# Patient Record
Sex: Male | Born: 1970
Health system: Southern US, Community
[De-identification: ages and names within clinical notes are randomized; demographics above are authoritative.]

## PROBLEM LIST (undated history)

## (undated) DIAGNOSIS — K921 Melena: Secondary | ICD-10-CM

## (undated) DIAGNOSIS — I10 Essential (primary) hypertension: Secondary | ICD-10-CM

## (undated) DIAGNOSIS — K5900 Constipation, unspecified: Secondary | ICD-10-CM

## (undated) DIAGNOSIS — E119 Type 2 diabetes mellitus without complications: Secondary | ICD-10-CM

## (undated) DIAGNOSIS — J309 Allergic rhinitis, unspecified: Secondary | ICD-10-CM

## (undated) DIAGNOSIS — M545 Low back pain: Secondary | ICD-10-CM

## (undated) DIAGNOSIS — J45909 Unspecified asthma, uncomplicated: Secondary | ICD-10-CM

## (undated) DIAGNOSIS — K219 Gastro-esophageal reflux disease without esophagitis: Secondary | ICD-10-CM

## (undated) HISTORY — DX: Melena: K92.1

## (undated) HISTORY — DX: Allergic rhinitis, unspecified: J30.9

## (undated) HISTORY — DX: Gastro-esophageal reflux disease without esophagitis: K21.9

## (undated) HISTORY — DX: Unspecified asthma, uncomplicated: J45.909

## (undated) HISTORY — DX: Essential (primary) hypertension: I10

## (undated) HISTORY — DX: Morbid (severe) obesity due to excess calories: E66.01

## (undated) HISTORY — DX: Low back pain: M54.5

## (undated) HISTORY — DX: Constipation, unspecified: K59.00

---

## 1999-04-24 ENCOUNTER — Encounter: Payer: Self-pay | Admitting: Emergency Medicine

## 1999-04-24 ENCOUNTER — Emergency Department (HOSPITAL_COMMUNITY): Admission: EM | Admit: 1999-04-24 | Discharge: 1999-04-24 | Payer: Self-pay | Admitting: Emergency Medicine

## 2002-03-23 ENCOUNTER — Emergency Department (HOSPITAL_COMMUNITY): Admission: EM | Admit: 2002-03-23 | Discharge: 2002-03-23 | Payer: Self-pay | Admitting: Emergency Medicine

## 2005-09-09 ENCOUNTER — Ambulatory Visit: Payer: Self-pay | Admitting: Internal Medicine

## 2005-09-16 ENCOUNTER — Ambulatory Visit: Payer: Self-pay | Admitting: Internal Medicine

## 2006-10-04 ENCOUNTER — Ambulatory Visit: Payer: Self-pay | Admitting: Internal Medicine

## 2006-10-22 ENCOUNTER — Emergency Department (HOSPITAL_COMMUNITY): Admission: EM | Admit: 2006-10-22 | Discharge: 2006-10-23 | Payer: Self-pay | Admitting: Emergency Medicine

## 2006-10-28 ENCOUNTER — Ambulatory Visit: Payer: Self-pay | Admitting: Internal Medicine

## 2006-10-28 LAB — CONVERTED CEMR LAB
BUN: 12 mg/dL (ref 6–23)
Basophils Relative: 1 % (ref 0.0–1.0)
Bilirubin, Direct: 0.2 mg/dL (ref 0.0–0.3)
CO2: 28 meq/L (ref 19–32)
Eosinophils Absolute: 0.2 10*3/uL (ref 0.0–0.6)
Eosinophils Relative: 3.2 % (ref 0.0–5.0)
GFR calc Af Amer: 123 mL/min
Glucose, Bld: 102 mg/dL — ABNORMAL HIGH (ref 70–99)
HDL: 32.1 mg/dL — ABNORMAL LOW (ref >39.0)
Hemoglobin: 14.9 g/dL (ref 13.0–17.0)
Lymphocytes Relative: 43.8 % (ref 12.0–46.0)
MCV: 86.4 fL (ref 78.0–100.0)
Monocytes Absolute: 0.5 10*3/uL (ref 0.2–0.7)
Monocytes Relative: 8.4 % (ref 3.0–11.0)
Neutro Abs: 2.8 10*3/uL (ref 1.4–7.7)
PSA: 0.66 ng/mL
PSA: 0.66 ng/mL (ref 0.10–4.00)
Potassium: 3.8 meq/L (ref 3.5–5.1)
TSH: 2.5 microintl units/mL (ref 0.35–5.50)
Total Protein: 7.4 g/dL (ref 6.0–8.3)
VLDL: 28 mg/dL (ref 0–40)

## 2006-11-26 ENCOUNTER — Ambulatory Visit: Payer: Self-pay | Admitting: Internal Medicine

## 2007-04-01 DIAGNOSIS — I10 Essential (primary) hypertension: Secondary | ICD-10-CM

## 2007-04-01 HISTORY — DX: Essential (primary) hypertension: I10

## 2007-04-07 ENCOUNTER — Encounter: Payer: Self-pay | Admitting: Internal Medicine

## 2007-04-07 DIAGNOSIS — J45909 Unspecified asthma, uncomplicated: Secondary | ICD-10-CM

## 2007-04-07 DIAGNOSIS — M545 Low back pain, unspecified: Secondary | ICD-10-CM

## 2007-04-07 DIAGNOSIS — J309 Allergic rhinitis, unspecified: Secondary | ICD-10-CM

## 2007-04-07 DIAGNOSIS — K219 Gastro-esophageal reflux disease without esophagitis: Secondary | ICD-10-CM

## 2007-04-07 HISTORY — DX: Low back pain, unspecified: M54.50

## 2007-04-07 HISTORY — DX: Unspecified asthma, uncomplicated: J45.909

## 2007-04-07 HISTORY — DX: Morbid (severe) obesity due to excess calories: E66.01

## 2007-04-07 HISTORY — DX: Gastro-esophageal reflux disease without esophagitis: K21.9

## 2007-04-07 HISTORY — DX: Allergic rhinitis, unspecified: J30.9

## 2008-09-05 ENCOUNTER — Ambulatory Visit: Payer: Self-pay | Admitting: Internal Medicine

## 2008-09-05 LAB — CONVERTED CEMR LAB
AST: 39 units/L — ABNORMAL HIGH (ref 0–37)
Albumin: 4.1 g/dL (ref 3.5–5.2)
Alkaline Phosphatase: 61 units/L (ref 39–117)
BUN: 8 mg/dL (ref 6–23)
Eosinophils Relative: 3.5 % (ref 0.0–5.0)
GFR calc Af Amer: 108 mL/min
Glucose, Bld: 124 mg/dL — ABNORMAL HIGH (ref 70–99)
HCT: 44.3 % (ref 39.0–52.0)
Hemoglobin, Urine: NEGATIVE
Hemoglobin: 15.2 g/dL (ref 13.0–17.0)
Monocytes Absolute: 0.5 10*3/uL (ref 0.1–1.0)
Monocytes Relative: 7.5 % (ref 3.0–12.0)
Nitrite: NEGATIVE
Platelets: 257 10*3/uL (ref 150–400)
Potassium: 4 meq/L (ref 3.5–5.1)
RBC: 5.06 M/uL (ref 4.22–5.81)
Specific Gravity, Urine: 1.02 (ref 1.000–1.03)
Total Protein, Urine: NEGATIVE mg/dL
Total Protein: 7 g/dL (ref 6.0–8.3)
WBC: 6 10*3/uL (ref 4.5–10.5)
pH: 5.5 (ref 5.0–8.0)

## 2008-09-06 ENCOUNTER — Ambulatory Visit: Payer: Self-pay | Admitting: Internal Medicine

## 2008-09-06 DIAGNOSIS — E739 Lactose intolerance, unspecified: Secondary | ICD-10-CM | POA: Insufficient documentation

## 2009-10-29 ENCOUNTER — Ambulatory Visit: Payer: Self-pay | Admitting: Internal Medicine

## 2009-10-29 DIAGNOSIS — K5900 Constipation, unspecified: Secondary | ICD-10-CM | POA: Insufficient documentation

## 2009-10-29 DIAGNOSIS — K921 Melena: Secondary | ICD-10-CM

## 2009-10-29 HISTORY — DX: Melena: K92.1

## 2009-10-29 HISTORY — DX: Constipation, unspecified: K59.00

## 2009-10-29 LAB — CONVERTED CEMR LAB
AST: 27 units/L (ref 0–37)
Alkaline Phosphatase: 56 units/L (ref 39–117)
BUN: 10 mg/dL (ref 6–23)
Basophils Absolute: 0.1 10*3/uL (ref 0.0–0.1)
Bilirubin Urine: NEGATIVE
Calcium: 9.1 mg/dL (ref 8.4–10.5)
Cholesterol: 119 mg/dL (ref 0–200)
Creatinine, Ser: 0.9 mg/dL (ref 0.4–1.5)
GFR calc non Af Amer: 120.49 mL/min (ref >60)
Glucose, Bld: 116 mg/dL — ABNORMAL HIGH (ref 70–99)
HDL: 40.2 mg/dL (ref >39.00)
Ketones, ur: NEGATIVE mg/dL
LDL Cholesterol: 64 mg/dL (ref 0–99)
Leukocytes, UA: NEGATIVE
Lymphocytes Relative: 36.7 % (ref 12.0–46.0)
Monocytes Relative: 10.7 % (ref 3.0–12.0)
Neutrophils Relative %: 48.3 % (ref 43.0–77.0)
PSA: 0.88 ng/mL (ref 0.10–4.00)
Platelets: 248 10*3/uL (ref 150.0–400.0)
RDW: 11.4 % — ABNORMAL LOW (ref 11.5–14.6)
Sodium: 141 meq/L (ref 135–145)
TSH: 0.77 microintl units/mL (ref 0.35–5.50)
Total Bilirubin: 0.7 mg/dL (ref 0.3–1.2)
Urine Glucose: NEGATIVE mg/dL
VLDL: 15.2 mg/dL (ref 0.0–40.0)
pH: 5.5 (ref 5.0–8.0)

## 2010-03-08 ENCOUNTER — Emergency Department (HOSPITAL_COMMUNITY): Admission: EM | Admit: 2010-03-08 | Discharge: 2010-03-08 | Payer: Self-pay | Admitting: Podiatry

## 2010-03-10 ENCOUNTER — Telehealth: Payer: Self-pay | Admitting: Internal Medicine

## 2010-03-10 DIAGNOSIS — R1011 Right upper quadrant pain: Secondary | ICD-10-CM

## 2010-03-25 ENCOUNTER — Ambulatory Visit (HOSPITAL_COMMUNITY): Admission: RE | Admit: 2010-03-25 | Discharge: 2010-03-25 | Payer: Self-pay | Admitting: Internal Medicine

## 2010-03-25 ENCOUNTER — Encounter: Payer: Self-pay | Admitting: Internal Medicine

## 2010-04-01 ENCOUNTER — Encounter: Payer: Self-pay | Admitting: Internal Medicine

## 2010-06-10 ENCOUNTER — Encounter: Payer: Self-pay | Admitting: Internal Medicine

## 2010-09-16 NOTE — Progress Notes (Signed)
Summary: referral  Phone Note Call from Patient Call back at [phone] Carollee Herter (wife) cell   Caller: Spouse Reason for Call: Referral Summary of Call: Pt's wife Carollee Herter is calling for a referral to a surgeon. Pt was seen in the ER on Saturday and was diagnosed with gallstones. She states that he is in a lot of pain even with medication and if possible would like a referral this week--please advise Initial call taken by: Brenton Grills MA,  March 10, 2010 10:11 AM  Follow-up for Phone Call        I reviewed the ER record; all normal except for the gallstones;  will need HIDA scan if having persistent pain, before surgeon would consider any surgury since the GB itself looked normal on the ultrasound  rec:  HIDA scan - I will order Follow-up by: Corwin Levins MD,  March 10, 2010 12:53 PM  Additional Follow-up for Phone Call Additional follow up Details #1::        spouse notified. Additional Follow-up by: Lucious Groves CMA,  March 10, 2010 3:09 PM  New Problems: RUQ PAIN (ICD-789.01)   New Problems: RUQ PAIN (ICD-789.01)

## 2010-09-16 NOTE — Assessment & Plan Note (Signed)
Summary: STOMACH PROBLEMS/NWS  #   Vital Signs:  Patient profile:   40 year old male Height:      68 inches Weight:      292.75 pounds BMI:     44.67 O2 Sat:      98 % on Room air Temp:     98.4 degrees F oral Pulse rate:   64 / minute BP sitting:   120 / 76  (left arm) Cuff size:   large  Vitals Entered ByZella Ball Ewing (October 29, 2009 9:28 AM)  O2 Flow:  Room air  CC: Stomach problems/RE   CC:  Stomach problems/RE.  History of Present Illness: pain not overly severe  , in fact denies pain, but feels some "discomfort"  only seems to occur jsut after eating, eats twice per day;  overall mild, lasting aobut 1 wk;  located mid lower abd, some to both sides a bit more on the right than left;  BM and passing gas tend to make better;  has been more constipated more lately in that less volume seems to be passed but no diet change or activity or work or "routine wise';  feels sluggish overall and has some discomfort in the testicle area that he assumed was due to sitting so much at work;  been off this wk and no testicle pain; denies STD risk (married and wears condoms);  discomfort not radiating to the back, although he has had usual lower back pain that are chroinc for him that are worse with riding, sitting,  (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 low grade temp ( ? caught virus from the son) but only seems to last 24 hrs.  Tried miralax but seemed to be worse the second day in that the pain was worse and did not have BM that day.    Problems Prior to Update: 1)  Glucose Intolerance  (ICD-271.3) 2)  Preventive Health Care  (ICD-V70.0) 3)  Low Back Pain  (ICD-724.2) 4)  Gerd  (ICD-530.81) 5)  Asthma  (ICD-493.90) 6)  Allergic Rhinitis  (ICD-477.9) 7)  Morbid Obesity  (ICD-278.01) 8)  Family History Diabetes 1st Degree Relative  (ICD-V18.0) 9)  Hypertension  (ICD-401.9)  Medications Prior to Update: 1)  Lisinopril 10 Mg Tabs  (Lisinopril) .Marland Kitchen.. 1 By Mouth Once Daily 2)  Adult Aspirin Ec Low Strength 81 Mg Tbec (Aspirin) .Marland Kitchen.. 1 By Mouth Once Daily 3)  Proair Hfa 108 (90 Base) Mcg/act Aers (Albuterol Sulfate) .... 2 Puffs Four Times Per Day As Needed 4)  Zantac 300 Mg Tabs (Ranitidine Hcl) .Marland Kitchen.. 1 By Mouth Once Daily  Current Medications (verified): 1)  Lisinopril 10 Mg Tabs (Lisinopril) .Marland Kitchen.. 1 By Mouth Once Daily 2)  Adult Aspirin Ec Low Strength 81 Mg Tbec (Aspirin) .Marland Kitchen.. 1 By Mouth Once Daily 3)  Proair Hfa 108 (90 Base) Mcg/act Aers (Albuterol Sulfate) .... 2 Puffs Four Times Per Day As Needed 4)  Ranitidine Hcl 150 Mg Caps (Ranitidine Hcl) .Marland Kitchen.. 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         all otherwise negative per pt -    Physical Exam  General:  alert and overweight-appearing.   Head:   normocephalic and atraumatic.   Eyes:  vision grossly intact, pupils equal, and pupils round.   Ears:  R ear normal and L ear normal.   Nose:  no external deformity and no nasal discharge.   Mouth:  no gingival abnormalities and pharynx pink and moist.   Neck:  supple and no masses.   Lungs:  normal respiratory effort and normal breath sounds.   Heart:  normal rate and regular rhythm.   Abdomen:  soft, non-tender, and normal bowel sounds.   Rectal:  deferred per pt Msk:  no joint tenderness and no joint swelling.   Extremities:  no edema, no erythema  Neurologic:  cranial nerves II-XII intact and strength normal in all extremities.     Impression & Recommendations:  Problem # 1:  Preventive Health Care (ICD-V70.0)  Overall doing well, age appropriate education and counseling updated and referral for appropriate preventive services done unless declined, immunizations up to date or declined, diet counseling done if overweight, urged to quit smoking if smokes , most recent labs reviewed and current ordered if appropriate, ecg reviewed or declined (interpretation per ECG scanned in the EMR if done); information regarding Medicare Prevention requirements given if appropriate   Orders: TLB-BMP (Basic Metabolic Panel-BMET) (80048-METABOL) TLB-CBC Platelet - w/Differential (85025-CBCD) TLB-Hepatic/Liver Function Pnl (80076-HEPATIC) TLB-Lipid Panel (80061-LIPID) TLB-TSH (Thyroid Stimulating Hormone) (84443-TSH) TLB-PSA (Prostate Specific Antigen) (84153-PSA) TLB-Udip ONLY (81003-UDIP)  Problem # 2:  CONSTIPATION (ICD-564.00) for OTC mag citrate, more active and cont'd diet fiber  Problem # 3:  HEMATOCHEZIA (ICD-578.1) small volume - declines colonsocpy at this time  Problem # 4:  GLUCOSE INTOLERANCE (ICD-271.3)  Orders: TLB-A1C / Hgb A1C (Glycohemoglobin) (83036-A1C) asympt - for a1c check  Complete Medication List: 1)  Lisinopril 10 Mg Tabs (Lisinopril) .Marland Kitchen.. 1 by mouth once  daily 2)  Adult Aspirin Ec Low Strength 81 Mg Tbec (Aspirin) .Marland Kitchen.. 1 by mouth once daily 3)  Proair Hfa 108 (90 Base) Mcg/act Aers (Albuterol sulfate) .... 2 puffs four times per day as needed 4)  Ranitidine Hcl 150 Mg Caps (Ranitidine hcl) .Marland Kitchen.. 1 by mouth once daily  Patient Instructions: 1)  Please go to the Lab in the basement for your blood and/or urine tests today 2)  please call if you would like the colonscopy to be ordered 3)  Continue all previous medications as before this visit  4)  you can try the senakot oTC daily , and/or colace 100 mg by mouth two times a day for stool softner 5)  you can also use for now the magnesium citrate - 1 bottle by mouth x 1 now 6)  Your presciriptoins were sent to your pharmacy 7)  Please schedule a follow-up appointment in 1 year or sooner if needed Prescriptions: RANITIDINE HCL 150 MG CAPS (RANITIDINE HCL) 1 by mouth once daily  #  90 x 3   Entered and Authorized by:   Corwin Levins MD   Signed by:   Corwin Levins MD on 10/29/2009   Method used:   Electronically to        [facility]. [address] [phone]* (retail)       [address]       [place], [place]  [address]       Ph: [phone] or [phone]       Fax: [phone]   RxID:   [id number] PROAIR HFA 108 (90 BASE) MCG/ACT AERS (ALBUTEROL SULFATE) 2 puffs four times per day as needed  #1 x 11   Entered and Authorized by:   Corwin Levins MD   Signed by:   Corwin Levins MD on 10/29/2009   Method used:   Electronically to        [facility] [address] [phone]* (retail)       [address]       [place], [place]  [address]       Ph: [phone] or [phone]       Fax: [phone]   RxID:   [id number] LISINOPRIL 10 MG TABS (LISINOPRIL) 1 by mouth once daily  #90 x 3   Entered and Authorized by:   Corwin Levins MD   Signed by:   Corwin Levins MD on 10/29/2009   Method used:   Electronically to        [facility]. [address] [phone]* (retail)       [address]       [place], [place]  [address]       Ph: [phone] or [phone]       Fax: [phone]   RxID:   [id number]

## 2010-09-16 NOTE — Miscellaneous (Signed)
Summary: Orders Update   Clinical Lists Changes  Orders: Added new Referral order of Surgical Referral (Surgery) - Signed 

## 2010-09-16 NOTE — Consult Note (Signed)
Summary: [facility]   Imported By: Sherian Rein 04/15/2010 11:45:30  _____________________________________________________________________  External Attachment:    Type:   Image     Comment:   External Document

## 2010-09-16 NOTE — Letter (Signed)
Summary: [facility]   Imported By: Lennie Odor 06/17/2010 11:02:11  _____________________________________________________________________  External Attachment:    Type:   Image     Comment:   External Document

## 2010-11-01 LAB — DIFFERENTIAL
Basophils Absolute: 0 10*3/uL (ref 0.0–0.1)
Basophils Relative: 0 % (ref 0–1)
Eosinophils Relative: 1 % (ref 0–5)
Monocytes Absolute: 0.5 10*3/uL (ref 0.1–1.0)
Neutro Abs: 5.5 10*3/uL (ref 1.7–7.7)

## 2010-11-01 LAB — URINALYSIS, ROUTINE W REFLEX MICROSCOPIC
Glucose, UA: NEGATIVE mg/dL
Hgb urine dipstick: NEGATIVE
Protein, ur: NEGATIVE mg/dL
pH: 5 (ref 5.0–8.0)

## 2010-11-01 LAB — CBC
HCT: 45.5 % (ref 39.0–52.0)
Hemoglobin: 15.6 g/dL (ref 13.0–17.0)
MCV: 88.7 fL (ref 78.0–100.0)
RBC: 5.14 MIL/uL (ref 4.22–5.81)
RDW: 12.4 % (ref 11.5–15.5)
WBC: 7.6 10*3/uL (ref 4.0–10.5)

## 2010-11-01 LAB — POCT CARDIAC MARKERS
CKMB, poc: <1 ng/mL — ABNORMAL LOW (ref 1.0–8.0)
Myoglobin, poc: 90.8 ng/mL (ref 12–200)
Troponin i, poc: <0.05 ng/mL (ref 0.00–0.09)

## 2010-11-01 LAB — COMPREHENSIVE METABOLIC PANEL
AST: 36 U/L (ref 0–37)
Albumin: 4.2 g/dL (ref 3.5–5.2)
Alkaline Phosphatase: 61 U/L (ref 39–117)
BUN: 12 mg/dL (ref 6–23)
CO2: 22 mEq/L (ref 19–32)
Chloride: 106 mEq/L (ref 96–112)
Potassium: 4 mEq/L (ref 3.5–5.1)
Total Bilirubin: 0.8 mg/dL (ref 0.3–1.2)

## 2010-11-08 ENCOUNTER — Other Ambulatory Visit: Payer: Self-pay | Admitting: Internal Medicine

## 2010-12-15 ENCOUNTER — Other Ambulatory Visit: Payer: Self-pay | Admitting: Internal Medicine

## 2011-01-16 ENCOUNTER — Other Ambulatory Visit: Payer: Self-pay | Admitting: Internal Medicine

## 2011-03-08 ENCOUNTER — Encounter: Payer: Self-pay | Admitting: Internal Medicine

## 2011-03-08 DIAGNOSIS — E119 Type 2 diabetes mellitus without complications: Secondary | ICD-10-CM

## 2011-03-08 DIAGNOSIS — Z0001 Encounter for general adult medical examination with abnormal findings: Secondary | ICD-10-CM | POA: Insufficient documentation

## 2011-03-08 DIAGNOSIS — Z0000 Encounter for general adult medical examination without abnormal findings: Secondary | ICD-10-CM | POA: Insufficient documentation

## 2011-03-08 HISTORY — DX: Type 2 diabetes mellitus without complications: E11.9

## 2011-03-10 ENCOUNTER — Encounter: Payer: Self-pay | Admitting: Internal Medicine

## 2011-03-16 ENCOUNTER — Other Ambulatory Visit: Payer: Self-pay | Admitting: Internal Medicine

## 2011-03-16 ENCOUNTER — Other Ambulatory Visit (INDEPENDENT_AMBULATORY_CARE_PROVIDER_SITE_OTHER): Payer: Self-pay

## 2011-03-16 DIAGNOSIS — Z0000 Encounter for general adult medical examination without abnormal findings: Secondary | ICD-10-CM

## 2011-03-16 DIAGNOSIS — Z1289 Encounter for screening for malignant neoplasm of other sites: Secondary | ICD-10-CM

## 2011-03-16 LAB — BASIC METABOLIC PANEL
GFR: 118.14 mL/min (ref >60.00)
Potassium: 4.1 mEq/L (ref 3.5–5.1)
Sodium: 143 mEq/L (ref 135–145)

## 2011-03-16 LAB — CBC WITH DIFFERENTIAL/PLATELET
Eosinophils Relative: 3.4 % (ref 0.0–5.0)
HCT: 45.9 % (ref 39.0–52.0)
Hemoglobin: 15.1 g/dL (ref 13.0–17.0)
Lymphs Abs: 2.7 10*3/uL (ref 0.7–4.0)
Monocytes Relative: 9.9 % (ref 3.0–12.0)
Neutro Abs: 3.6 10*3/uL (ref 1.4–7.7)
Platelets: 275 10*3/uL (ref 150.0–400.0)
WBC: 7.3 10*3/uL (ref 4.5–10.5)

## 2011-03-16 LAB — LIPID PANEL
Cholesterol: 135 mg/dL (ref 0–200)
LDL Cholesterol: 73 mg/dL (ref 0–99)
VLDL: 27.2 mg/dL (ref 0.0–40.0)

## 2011-03-16 LAB — PSA: PSA: 1.02 ng/mL (ref 0.10–4.00)

## 2011-03-16 LAB — HEPATIC FUNCTION PANEL
ALT: 43 U/L (ref 0–53)
AST: 23 U/L (ref 0–37)
Albumin: 4.4 g/dL (ref 3.5–5.2)
Total Protein: 7.3 g/dL (ref 6.0–8.3)

## 2011-03-16 LAB — URINALYSIS
Bilirubin Urine: NEGATIVE
Hgb urine dipstick: NEGATIVE
Ketones, ur: NEGATIVE
Nitrite: NEGATIVE
Total Protein, Urine: NEGATIVE

## 2011-03-17 ENCOUNTER — Encounter: Payer: Self-pay | Admitting: Internal Medicine

## 2011-03-17 ENCOUNTER — Ambulatory Visit (INDEPENDENT_AMBULATORY_CARE_PROVIDER_SITE_OTHER): Payer: BC Managed Care – PPO | Admitting: Internal Medicine

## 2011-03-17 VITALS — BP 120/82 | HR 77 | Temp 98.6°F | Ht 68.0 in | Wt 294.4 lb

## 2011-03-17 DIAGNOSIS — R609 Edema, unspecified: Secondary | ICD-10-CM

## 2011-03-17 DIAGNOSIS — Z0000 Encounter for general adult medical examination without abnormal findings: Secondary | ICD-10-CM

## 2011-03-17 DIAGNOSIS — R7309 Other abnormal glucose: Secondary | ICD-10-CM

## 2011-03-17 DIAGNOSIS — I10 Essential (primary) hypertension: Secondary | ICD-10-CM

## 2011-03-17 DIAGNOSIS — R7302 Impaired glucose tolerance (oral): Secondary | ICD-10-CM

## 2011-03-17 MED ORDER — LISINOPRIL-HYDROCHLOROTHIAZIDE 10-12.5 MG PO TABS: 1.0000 | ORAL_TABLET | Freq: Every day | ORAL | Status: DC

## 2011-03-17 NOTE — Assessment & Plan Note (Signed)
Trace bilat LE edema, prob venous insuff;  To change the BP med to lis-hct as per emr.,  to f/u any worsening symptoms or concerns

## 2011-03-17 NOTE — Assessment & Plan Note (Signed)
Improved, BP med changed to add the hct today due to edema,  to f/u any worsening symptoms or concerns

## 2011-03-17 NOTE — Progress Notes (Signed)
Subjective:    Patient ID: Jerome [name], male    DOB: 01-19-1971, 40 y.o.   MRN: [id number]  HPI  Here for wellness and f/u;  Overall doing ok;  Pt denies CP, worsening SOB, DOE, wheezing, orthopnea, PND, worsening LE edema, palpitations, dizziness or syncope.  Pt denies neurological change such as new Headache, facial or extremity weakness.  Pt denies polydipsia, polyuria, or low sugar symptoms. Pt states overall good compliance with treatment and medications, good tolerability, and trying to follow lower cholesterol diet.  Pt denies worsening depressive symptoms, suicidal ideation or panic. No fever, wt loss, night sweats, loss of appetite, or other constitutional symptoms.  Pt states good ability with ADL's, low fall risk, home safety reviewed and adequate, no significant changes in hearing or vision, and occasionally active with exercise.  Hard to lose wt as he works third shift, will be changing shift n the next couple. Does have some snoring at night, but o/w no daytime somnolence. Works 65-70 hrs per wk.  Has good diet, but cant find time to exercise.  Past Medical History  Diagnosis Date  . Impaired glucose tolerance 03/08/2011  . ALLERGIC RHINITIS 04/07/2007  . ASTHMA 04/07/2007  . CONSTIPATION 10/29/2009  . GERD 04/07/2007  . HEMATOCHEZIA 10/29/2009  . HYPERTENSION 04/01/2007  . LOW BACK PAIN 04/07/2007  . Morbid obesity 04/07/2007   No past surgical history on file.  reports that he has been smoking.  He does not have any smokeless tobacco history on file. He reports that he drinks alcohol. He reports that he does not use illicit drugs. family history includes Diabetes in his other; Prostate cancer in his other; Stroke in his other; and Throat cancer in his other. No Known Allergies Current Outpatient Prescriptions on File Prior to Visit  Medication Sig Dispense Refill  . lisinopril (PRINIVIL,ZESTRIL) 10 MG tablet TAKE 1 TABLET BY MOUTH EVERY DAY  30 tablet  1  . albuterol (PROAIR HFA) 108  (90 BASE) MCG/ACT inhaler Inhale 2 puffs into the lungs 4 (four) times daily.        Marland Kitchen aspirin 81 MG tablet Take 81 mg by mouth daily.        . ranitidine (ZANTAC) 150 MG tablet Take 150 mg by mouth daily.         Review of Systems Review of Systems  Constitutional: Negative for diaphoresis, activity change, appetite change and unexpected weight change.  HENT: Negative for hearing loss, ear pain, facial swelling, mouth sores and neck stiffness.   Eyes: Negative for pain, redness and visual disturbance.  Respiratory: Negative for shortness of breath and wheezing.   Cardiovascular: Negative for chest pain and palpitations.  Gastrointestinal: Negative for diarrhea, blood in stool, abdominal distention and rectal pain.  Genitourinary: Negative for hematuria, flank pain and decreased urine volume.  Musculoskeletal: Negative for myalgias and joint swelling.  Skin: Negative for color change and wound.  Neurological: Negative for syncope and numbness.  Hematological: Negative for adenopathy.  Psychiatric/Behavioral: Negative for hallucinations, self-injury, decreased concentration and agitation.      Objective:   Physical Exam BP 120/82  Pulse 77  Temp(Src) 98.6 F (37 C) (Oral)  Ht 5\' 8"  (1.727 m)  Wt 294 lb 6 oz (133.528 kg)  BMI 44.76 kg/m2  SpO2 96% Physical Exam  VS noted Constitutional: Pt is oriented to person, place, and time. Appears well-developed and well-nourished.  HENT:  Head: Normocephalic and atraumatic.  Right Ear: External ear normal.  Left Ear: External ear normal.  Nose: Nose normal.  Mouth/Throat: Oropharynx is clear and moist.  Eyes: Conjunctivae and EOM are normal. Pupils are equal, round, and reactive to light.  Neck: Normal range of motion. Neck supple. No JVD present. No tracheal deviation present.  Cardiovascular: Normal rate, regular rhythm, normal heart sounds and intact distal pulses.   Pulmonary/Chest: Effort normal and breath sounds normal.    Abdominal: Soft. Bowel sounds are normal. There is no tenderness.  Musculoskeletal: Normal range of motion. Exhibits trace edema ankle bilat  Lymphadenopathy:  Has no cervical adenopathy.  Neurological: Pt is alert and oriented to person, place, and time. Pt has normal reflexes. No cranial nerve deficit.  Skin: Skin is warm and dry. No rash noted.  Psychiatric:  Has  normal mood and affect. Behavior is normal.         Assessment & Plan:

## 2011-03-17 NOTE — Patient Instructions (Signed)
Stop the lisinopril 10 mg per day Take all new medications as prescribed - the lisinopril-HCT 10/12.5 mg - 1 per day Please continue to monitor your BP regularly;  Your goal is to be < 140/90 You are otherwise uptodate with your prevention .Please return in 1 year for your yearly visit, or sooner if needed, with Lab testing done 3-5 days before

## 2011-03-17 NOTE — Assessment & Plan Note (Signed)

## 2011-03-20 ENCOUNTER — Other Ambulatory Visit: Payer: Self-pay | Admitting: Internal Medicine

## 2011-06-02 ENCOUNTER — Telehealth: Payer: Self-pay

## 2011-06-02 MED ORDER — LOSARTAN POTASSIUM 100 MG PO TABS: 100.0000 mg | ORAL_TABLET | Freq: Every day | ORAL | Status: DC

## 2011-06-02 NOTE — Telephone Encounter (Signed)
Ok to change to losartan 100 qd (rememeber the numbers dont compare in any way to the old medicine numbers)

## 2011-06-02 NOTE — Telephone Encounter (Signed)
Pt informed of Rx/pharmacy via VM,

## 2011-06-02 NOTE — Telephone Encounter (Signed)
Pt called stating his BP medication is causing fatigue, dizziness and deceased appetite. Pt is requesting Rx for alternative medication, please advise.

## 2011-10-23 ENCOUNTER — Telehealth: Payer: Self-pay

## 2011-10-23 NOTE — Telephone Encounter (Signed)
Can he check BP either at home, pharmacy or come here ?  If not able, should take HALF pill until able to come for BP nurse check next wk

## 2011-10-23 NOTE — Telephone Encounter (Signed)
Called left message to call back 

## 2011-10-23 NOTE — Telephone Encounter (Signed)
Pt called stating he has been experiencing lightheadedness, tingling in finger tips, jittery and nervous since starting on new BP medication. Pt is requesting advisement from MD.

## 2011-10-26 NOTE — Telephone Encounter (Signed)
Patient informed, he stated he went to UC over the weekend and stated he was feeling much better.

## 2011-11-20 ENCOUNTER — Encounter: Payer: Self-pay | Admitting: *Deleted

## 2012-01-26 ENCOUNTER — Telehealth: Payer: Self-pay | Admitting: *Deleted

## 2012-01-26 ENCOUNTER — Encounter: Payer: Self-pay | Admitting: *Deleted

## 2012-01-26 NOTE — Telephone Encounter (Signed)
Called patient to let him know that he can stop the prednisone. Patient verbalized understanding.

## 2012-02-10 ENCOUNTER — Encounter: Payer: Self-pay | Admitting: *Deleted

## 2012-02-10 NOTE — Progress Notes (Signed)
Faxed signed clearance for surgery from oncologist standpoint to dr Thurston Hole [phone]

## 2012-03-02 ENCOUNTER — Telehealth: Payer: Self-pay | Admitting: *Deleted

## 2012-03-02 ENCOUNTER — Encounter: Payer: Self-pay | Admitting: Oncology

## 2012-03-02 NOTE — Telephone Encounter (Signed)
Wife calling to say patient was in the E.R.last night, with elevated temp. They drew labs, did chest-xray, gave po and iv potassium and put him on an antibiotic, (she does not know the name). Temperature today is 100.  Wife would like for him to be seen by dr Clelia Croft, sooner than scheduled appt for august 9th.

## 2012-03-02 NOTE — Telephone Encounter (Signed)
No note

## 2012-03-02 NOTE — Telephone Encounter (Signed)
Spoke with wife, if temp goes above 100, we will see patient this week.

## 2012-04-12 ENCOUNTER — Telehealth: Payer: Self-pay

## 2012-04-12 DIAGNOSIS — Z0000 Encounter for general adult medical examination without abnormal findings: Secondary | ICD-10-CM

## 2012-04-12 NOTE — Telephone Encounter (Signed)
Put order in for physical labs. 

## 2012-06-14 ENCOUNTER — Other Ambulatory Visit: Payer: Self-pay

## 2012-06-14 MED ORDER — LOSARTAN POTASSIUM 100 MG PO TABS: 100.0000 mg | ORAL_TABLET | Freq: Every day | ORAL | Status: DC

## 2012-06-15 ENCOUNTER — Other Ambulatory Visit (INDEPENDENT_AMBULATORY_CARE_PROVIDER_SITE_OTHER): Payer: BC Managed Care – PPO

## 2012-06-15 DIAGNOSIS — Z0000 Encounter for general adult medical examination without abnormal findings: Secondary | ICD-10-CM

## 2012-06-15 LAB — TSH: TSH: 1.25 u[IU]/mL (ref 0.35–5.50)

## 2012-06-15 LAB — PSA: PSA: 0.9 ng/mL (ref 0.10–4.00)

## 2012-06-15 LAB — CBC WITH DIFFERENTIAL/PLATELET
Basophils Absolute: 0 10*3/uL (ref 0.0–0.1)
Eosinophils Absolute: 0.2 10*3/uL (ref 0.0–0.7)
Hemoglobin: 15.3 g/dL (ref 13.0–17.0)
Lymphocytes Relative: 40.8 % (ref 12.0–46.0)
Monocytes Relative: 7.7 % (ref 3.0–12.0)
Neutrophils Relative %: 47.8 % (ref 43.0–77.0)
Platelets: 276 10*3/uL (ref 150.0–400.0)
RDW: 11.9 % (ref 11.5–14.6)

## 2012-06-15 LAB — URINALYSIS, ROUTINE W REFLEX MICROSCOPIC
Bilirubin Urine: NEGATIVE
Hgb urine dipstick: NEGATIVE
Leukocytes, UA: NEGATIVE
Nitrite: NEGATIVE
Total Protein, Urine: NEGATIVE

## 2012-06-15 LAB — BASIC METABOLIC PANEL
BUN: 10 mg/dL (ref 6–23)
CO2: 28 mEq/L (ref 19–32)
Calcium: 9.4 mg/dL (ref 8.4–10.5)
Creatinine, Ser: 1 mg/dL (ref 0.4–1.5)

## 2012-06-15 LAB — LIPID PANEL
Cholesterol: 143 mg/dL (ref 0–200)
LDL Cholesterol: 83 mg/dL (ref 0–99)
Total CHOL/HDL Ratio: 4
VLDL: 22.2 mg/dL (ref 0.0–40.0)

## 2012-06-15 LAB — HEPATIC FUNCTION PANEL
ALT: 61 U/L — ABNORMAL HIGH (ref 0–53)
AST: 33 U/L (ref 0–37)
Albumin: 3.7 g/dL (ref 3.5–5.2)
Total Bilirubin: 0.5 mg/dL (ref 0.3–1.2)

## 2012-06-16 LAB — HEMOGLOBIN A1C: Hgb A1c MFr Bld: 5.9 % (ref 4.6–6.5)

## 2012-06-17 ENCOUNTER — Encounter: Payer: Self-pay | Admitting: Internal Medicine

## 2012-06-17 ENCOUNTER — Ambulatory Visit (INDEPENDENT_AMBULATORY_CARE_PROVIDER_SITE_OTHER): Payer: BC Managed Care – PPO | Admitting: Internal Medicine

## 2012-06-17 VITALS — BP 112/80 | HR 97 | Temp 98.2°F | Ht 68.0 in | Wt 303.2 lb

## 2012-06-17 DIAGNOSIS — R7302 Impaired glucose tolerance (oral): Secondary | ICD-10-CM

## 2012-06-17 DIAGNOSIS — Z0000 Encounter for general adult medical examination without abnormal findings: Secondary | ICD-10-CM

## 2012-06-17 DIAGNOSIS — R7309 Other abnormal glucose: Secondary | ICD-10-CM

## 2012-06-17 NOTE — Patient Instructions (Addendum)
Ok to stay off the losartan Please check your blood pressure on a regular basis; your goal is to be less than 140/90 Continue all other medications as before Please have the pharmacy call with any refills you may need. Please continue your efforts at being more active, low cholesterol diet, and weight control. Please return in 1 year for your yearly visit, or sooner if needed, with Lab testing done 3-5 days before

## 2012-06-17 NOTE — Progress Notes (Signed)
Subjective:    Patient ID: Jerome [name], male    DOB: October 28, 1970, 41 y.o.   MRN: [id number]  HPI Here for wellness and f/u;  Overall doing ok;  Pt denies CP, worsening SOB, DOE, wheezing, orthopnea, PND, worsening LE edema, palpitations, dizziness or syncope.  Pt denies neurological change such as new Headache, facial or extremity weakness.  Pt denies polydipsia, polyuria, or low sugar symptoms. Pt states overall good compliance with treatment and medications, good tolerability, and trying to follow lower cholesterol diet.  Pt denies worsening depressive symptoms, suicidal ideation or panic. No fever, wt loss, night sweats, loss of appetite, or other constitutional symptoms.  Pt states good ability with ADL's, low fall risk, home safety reviewed and adequate, no significant changes in hearing or vision, and occasionally active with exercise.  Had to stop the losartan due to dizziness, BP ok at home.  Does have sense of ongoing fatigue, but denies signficant hypersomnolence. Still works 3 rd shift. Past Medical History  Diagnosis Date  . Impaired glucose tolerance 03/08/2011  . ALLERGIC RHINITIS 04/07/2007  . ASTHMA 04/07/2007  . CONSTIPATION 10/29/2009  . GERD 04/07/2007  . HEMATOCHEZIA 10/29/2009  . HYPERTENSION 04/01/2007  . LOW BACK PAIN 04/07/2007  . Morbid obesity 04/07/2007   No past surgical history on file.  reports that he has been smoking.  He does not have any smokeless tobacco history on file. He reports that he drinks alcohol. He reports that he does not use illicit drugs. family history includes Diabetes in his other; Prostate cancer in his other; Stroke in his other; and Throat cancer in his other. No Known Allergies Current Outpatient Prescriptions on File Prior to Visit  Medication Sig Dispense Refill  . aspirin 81 MG tablet Take 81 mg by mouth daily.        Marland Kitchen losartan (COZAAR) 100 MG tablet Take 1 tablet (100 mg total) by mouth daily.  90 tablet  0  . PROAIR HFA 108 (90 BASE)  MCG/ACT inhaler USE 2 PUFFS FOUR TIMES DAILY AS NEEDED  1 Inhaler  11  . ranitidine (ZANTAC) 150 MG tablet Take 150 mg by mouth daily.         Review of Systems Review of Systems  Constitutional: Negative for diaphoresis, activity change, appetite change and unexpected weight change.  HENT: Negative for hearing loss, ear pain, facial swelling, mouth sores and neck stiffness.   Eyes: Negative for pain, redness and visual disturbance.  Respiratory: Negative for shortness of breath and wheezing.   Cardiovascular: Negative for chest pain and palpitations.  Gastrointestinal: Negative for diarrhea, blood in stool, abdominal distention and rectal pain.  Genitourinary: Negative for hematuria, flank pain and decreased urine volume.  Musculoskeletal: Negative for myalgias and joint swelling.  Skin: Negative for color change and wound.  Neurological: Negative for syncope and numbness.  Hematological: Negative for adenopathy.  Psychiatric/Behavioral: Negative for hallucinations, self-injury, decreased concentration and agitation.      Objective:   Physical Exam BP 112/80  Pulse 97  Temp 98.2 F (36.8 C) (Oral)  Ht 5\' 8"  (1.727 m)  Wt 303 lb 4 oz (137.553 kg)  BMI 46.11 kg/m2  SpO2 95% Physical Exam  VS noted Constitutional: Pt is oriented to person, place, and time. Appears well-developed and well-nourished./obese  HENT:  Head: Normocephalic and atraumatic.  Right Ear: External ear normal.  Left Ear: External ear normal.  Nose: Nose normal.  Mouth/Throat: Oropharynx is clear and moist.  Eyes: Conjunctivae and EOM are normal. Pupils  are equal, round, and reactive to light.  Neck: Normal range of motion. Neck supple. No JVD present. No tracheal deviation present.  Cardiovascular: Normal rate, regular rhythm, normal heart sounds and intact distal pulses.   Pulmonary/Chest: Effort normal and breath sounds normal.  Abdominal: Soft. Bowel sounds are normal. There is no tenderness.    Musculoskeletal: Normal range of motion. Exhibits no edema.  Lymphadenopathy:  Has no cervical adenopathy.  Neurological: Pt is alert and oriented to person, place, and time. Pt has normal reflexes. No cranial nerve deficit.  Skin: Skin is warm and dry. No rash noted.  Psychiatric:  Has  normal mood and affect. Behavior is normal.     Assessment & Plan:

## 2012-06-18 ENCOUNTER — Encounter: Payer: Self-pay | Admitting: Internal Medicine

## 2012-06-18 NOTE — Assessment & Plan Note (Signed)

## 2012-06-18 NOTE — Assessment & Plan Note (Signed)
stable overall by hx and exam, most recent data reviewed with pt, and pt to continue medical treatment as before Lab Results  Component Value Date   HGBA1C 5.9 06/15/2012

## 2012-08-17 HISTORY — PX: CHOLECYSTECTOMY: SHX55

## 2012-09-12 ENCOUNTER — Telehealth: Payer: Self-pay | Admitting: *Deleted

## 2012-09-12 NOTE — Telephone Encounter (Signed)
Patient to have radiation treatments, before starting jevtana, chemotherapy. Cancelled chemo and inj appt for 1/29 and 1/30, respectively.

## 2012-10-24 ENCOUNTER — Ambulatory Visit (INDEPENDENT_AMBULATORY_CARE_PROVIDER_SITE_OTHER): Payer: BC Managed Care – PPO | Admitting: Internal Medicine

## 2012-10-24 ENCOUNTER — Telehealth: Payer: Self-pay | Admitting: Internal Medicine

## 2012-10-24 ENCOUNTER — Other Ambulatory Visit (INDEPENDENT_AMBULATORY_CARE_PROVIDER_SITE_OTHER): Payer: BC Managed Care – PPO

## 2012-10-24 ENCOUNTER — Ambulatory Visit (INDEPENDENT_AMBULATORY_CARE_PROVIDER_SITE_OTHER)
Admission: RE | Admit: 2012-10-24 | Discharge: 2012-10-24 | Disposition: A | Discharge status: Home / Self Care | Payer: BC Managed Care – PPO | Source: Ambulatory Visit | Attending: Internal Medicine | Admitting: Internal Medicine

## 2012-10-24 ENCOUNTER — Encounter: Payer: Self-pay | Admitting: Internal Medicine

## 2012-10-24 VITALS — BP 118/80 | HR 81 | Temp 98.1°F | Ht 68.5 in | Wt 285.0 lb

## 2012-10-24 DIAGNOSIS — R7302 Impaired glucose tolerance (oral): Secondary | ICD-10-CM

## 2012-10-24 DIAGNOSIS — R634 Abnormal weight loss: Secondary | ICD-10-CM

## 2012-10-24 DIAGNOSIS — R7309 Other abnormal glucose: Secondary | ICD-10-CM

## 2012-10-24 DIAGNOSIS — R350 Frequency of micturition: Secondary | ICD-10-CM

## 2012-10-24 DIAGNOSIS — Z0000 Encounter for general adult medical examination without abnormal findings: Secondary | ICD-10-CM

## 2012-10-24 DIAGNOSIS — I10 Essential (primary) hypertension: Secondary | ICD-10-CM

## 2012-10-24 LAB — CORTISOL: Cortisol, Plasma: 3.9 ug/dL

## 2012-10-24 LAB — HEPATIC FUNCTION PANEL
Albumin: 3.9 g/dL (ref 3.5–5.2)
Alkaline Phosphatase: 85 U/L (ref 39–117)
Bilirubin, Direct: 0.2 mg/dL (ref 0.0–0.3)

## 2012-10-24 LAB — LIPID PANEL
LDL Cholesterol: 61 mg/dL (ref 0–99)
Total CHOL/HDL Ratio: 4
Triglycerides: 103 mg/dL (ref 0.0–149.0)
VLDL: 20.6 mg/dL (ref 0.0–40.0)

## 2012-10-24 LAB — CBC WITH DIFFERENTIAL/PLATELET
Basophils Absolute: 0 10*3/uL (ref 0.0–0.1)
Eosinophils Absolute: 0.1 10*3/uL (ref 0.0–0.7)
Lymphocytes Relative: 39.5 % (ref 12.0–46.0)
MCHC: 33.8 g/dL (ref 30.0–36.0)
Neutrophils Relative %: 51.2 % (ref 43.0–77.0)
RDW: 11.5 % (ref 11.5–14.6)

## 2012-10-24 LAB — BASIC METABOLIC PANEL
BUN: 11 mg/dL (ref 6–23)
CO2: 25 mEq/L (ref 19–32)
Chloride: 102 mEq/L (ref 96–112)
Creatinine, Ser: 0.9 mg/dL (ref 0.4–1.5)

## 2012-10-24 LAB — URINALYSIS, ROUTINE W REFLEX MICROSCOPIC
Leukocytes, UA: NEGATIVE
Nitrite: NEGATIVE
Specific Gravity, Urine: 1.02 (ref 1.000–1.030)
Urobilinogen, UA: 0.2 (ref 0.0–1.0)

## 2012-10-24 LAB — PSA: PSA: 0.89 ng/mL (ref 0.10–4.00)

## 2012-10-24 MED ORDER — METFORMIN HCL ER 500 MG PO TB24: 1000.0000 mg | ORAL_TABLET | Freq: Every day | ORAL | Status: DC

## 2012-10-24 NOTE — Progress Notes (Signed)
  Subjective:    Patient ID: Jerome [name], male    DOB: October 05, 1970, 42 y.o.   MRN: [id number]  HPI Here with 2 wks onset fatigue, blurred vision, thirsty and urinary freq and urgency large amounts.  No signifcant ETOH or OTC medications.  Denies urinary symptoms such as dysuria, flank pain, hematuria or n/v, fever, chills.  Also drinking more fluids due to dry mouth.  No change in meds, but did re-start the losartan but no change in urinary symptoms.  No hx of prostatitis.  Has lost wt from 303 in nov 2013 to current 285, unintentional. No change in diet. Monogamous with wife, no hx of std's.  Last a1c normal just 4 mo ago.   Pt states overall good compliance with meds, trying to follow lower carb diet.  Past Medical History  Diagnosis Date  . Impaired glucose tolerance 03/08/2011  . ALLERGIC RHINITIS 04/07/2007  . ASTHMA 04/07/2007  . CONSTIPATION 10/29/2009  . GERD 04/07/2007  . HEMATOCHEZIA 10/29/2009  . HYPERTENSION 04/01/2007  . LOW BACK PAIN 04/07/2007  . Morbid obesity 04/07/2007   No past surgical history on file.  reports that he has been smoking.  He does not have any smokeless tobacco history on file. He reports that  drinks alcohol. He reports that he does not use illicit drugs. family history includes Diabetes in his other; Prostate cancer in his other; Stroke in his other; and Throat cancer in his other. No Known Allergies Current Outpatient Prescriptions on File Prior to Visit  Medication Sig Dispense Refill  . aspirin 81 MG tablet Take 81 mg by mouth daily.        . ranitidine (ZANTAC) 150 MG tablet Take 150 mg by mouth daily.         No current facility-administered medications on file prior to visit.   Review of Systems  Constitutional: Negative for unexpected weight change, or unusual diaphoresis  HENT: Negative for tinnitus.   Eyes: Negative for photophobia and visual disturbance.  Respiratory: Negative for choking and stridor.   Gastrointestinal: Negative for vomiting and  blood in stool.  Genitourinary: Negative for hematuria and decreased urine volume.  Musculoskeletal: Negative for acute joint swelling Skin: Negative for color change and wound.  Neurological: Negative for tremors and numbness other than noted  Psychiatric/Behavioral: Negative for decreased concentration or  hyperactivity.       Objective:   Physical Exam BP 118/80  Pulse 81  Temp(Src) 98.1 F (36.7 C) (Oral)  Ht 5' 8.5" (1.74 m)  Wt 285 lb (129.275 kg)  BMI 42.7 kg/m2  SpO2 96% VS noted,  Constitutional: Pt appears well-developed and well-nourished.  HENT: Head: NCAT.  Right Ear: External ear normal.  Left Ear: External ear normal.  Eyes: Conjunctivae and EOM are normal. Pupils are equal, round, and reactive to light.  Neck: Normal range of motion. Neck supple.  Cardiovascular: Normal rate and regular rhythm.   Pulmonary/Chest: Effort normal and breath sounds normal.  Abd:  Soft, NT, non-distended, + BS Neurological: Pt is alert. Not confused  Skin: Skin is warm. No erythema.  Psychiatric: Pt behavior is normal. Thought content normal.     Assessment & Plan:

## 2012-10-24 NOTE — Telephone Encounter (Signed)
Jerome [name] - to call pt, needs ROV in 3 mo with labs prior (i ordered)

## 2012-10-24 NOTE — Assessment & Plan Note (Signed)
If not related to DM, etiology o/w not clear, for cxr,  to f/u any worsening symptoms or concerns

## 2012-10-24 NOTE — Assessment & Plan Note (Addendum)
With urgency as well and what sounds like polyuria; for urine/serum osm, cortisol, TSH, bmet, ua, urine cx,  to f/u any worsening symptoms or concerns  -

## 2012-10-24 NOTE — Assessment & Plan Note (Signed)
Has mult s/s suggestive of worsening DM, which would be unusual given the recent a1c, and denies change in diet and activity;  For f/u labs today

## 2012-10-24 NOTE — Assessment & Plan Note (Signed)
stable overall by history and exam, recent data reviewed with pt, and pt to continue medical treatment as before,  to f/u any worsening symptoms or concerns BP Readings from Last 3 Encounters:  10/24/12 118/80  06/17/12 112/80  03/17/11 120/82

## 2012-10-24 NOTE — Patient Instructions (Addendum)
Please continue all other medications as before, and refills have been done if requested. Please go to the LAB in the Basement (turn left off the elevator) for the tests to be done today Please go to the XRAY Department in the Basement (go straight as you get off the elevator) for the x-ray testing You will be contacted by phone if any changes need to be made immediately.  Otherwise, you will receive a letter about your results with an explanation, but please check with MyChart first. If the testing is negative, you may wish to see urology for further evaluation Thank you for enrolling in MyChart. Please follow the instructions below to securely access your online medical record. MyChart allows you to send messages to your doctor, view your test results, renew your prescriptions, schedule appointments, and more. To Log into My Chart online, please go by [employer] or [employer] to [email or web address], or download the MyChart App from the [employer] .  Your Username is:  [phone] (pass [phone]) Please send a practice Message on Mychart later today Please return in 6 months, or sooner if needed

## 2012-10-25 LAB — OSMOLALITY, URINE: Osmolality, Ur: 600 mOsm/kg (ref 390–1090)

## 2012-10-25 NOTE — Telephone Encounter (Signed)
Pt made an appt in June.

## 2012-11-02 ENCOUNTER — Other Ambulatory Visit: Payer: Self-pay | Admitting: *Deleted

## 2012-11-29 ENCOUNTER — Ambulatory Visit: Payer: BC Managed Care – PPO | Admitting: Dietician

## 2013-01-05 ENCOUNTER — Encounter: Payer: Self-pay | Admitting: *Deleted

## 2013-01-24 ENCOUNTER — Telehealth: Payer: Self-pay | Admitting: *Deleted

## 2013-01-24 ENCOUNTER — Other Ambulatory Visit (INDEPENDENT_AMBULATORY_CARE_PROVIDER_SITE_OTHER): Payer: BC Managed Care – PPO

## 2013-01-24 LAB — HEPATIC FUNCTION PANEL
ALT: 50 U/L (ref 0–53)
AST: 25 U/L (ref 0–37)
Bilirubin, Direct: 0.1 mg/dL (ref 0.0–0.3)
Total Protein: 7.4 g/dL (ref 6.0–8.3)

## 2013-01-24 LAB — BASIC METABOLIC PANEL
CO2: 25 mEq/L (ref 19–32)
Chloride: 107 mEq/L (ref 96–112)
Creatinine, Ser: 1 mg/dL (ref 0.4–1.5)

## 2013-01-24 LAB — LIPID PANEL
Cholesterol: 104 mg/dL (ref 0–200)
Total CHOL/HDL Ratio: 3
Triglycerides: 82 mg/dL (ref 0.0–149.0)

## 2013-01-24 NOTE — Telephone Encounter (Signed)
Left msg on triage have appt tomorrow. Wanting to see does md want labs prior to appt. Called pt back no answer LMOM per chart md has place orders to have labs done prior can come in today to have done...Jerome [name]

## 2013-01-25 ENCOUNTER — Encounter: Payer: Self-pay | Admitting: Internal Medicine

## 2013-01-25 ENCOUNTER — Ambulatory Visit (INDEPENDENT_AMBULATORY_CARE_PROVIDER_SITE_OTHER): Payer: BC Managed Care – PPO | Admitting: Internal Medicine

## 2013-01-25 VITALS — BP 120/80 | HR 68 | Temp 98.7°F | Ht 69.0 in | Wt 281.5 lb

## 2013-01-25 DIAGNOSIS — I10 Essential (primary) hypertension: Secondary | ICD-10-CM

## 2013-01-25 DIAGNOSIS — Z0000 Encounter for general adult medical examination without abnormal findings: Secondary | ICD-10-CM

## 2013-01-25 DIAGNOSIS — K219 Gastro-esophageal reflux disease without esophagitis: Secondary | ICD-10-CM

## 2013-01-25 MED ORDER — GLUCOSE BLOOD VI STRP: ORAL_STRIP | Status: DC

## 2013-01-25 MED ORDER — LISINOPRIL 5 MG PO TABS: 5.0000 mg | ORAL_TABLET | Freq: Every day | ORAL | Status: DC

## 2013-01-25 MED ORDER — METFORMIN HCL ER 500 MG PO TB24: 500.0000 mg | ORAL_TABLET | Freq: Every day | ORAL | Status: DC

## 2013-01-25 MED ORDER — LANCETS MISC: Status: DC

## 2013-01-25 MED ORDER — FREESTYLE SYSTEM KIT: 1.0000 | PACK | Status: DC | PRN

## 2013-01-25 NOTE — Progress Notes (Signed)
  Subjective:    Patient ID: Jerome [name], male    DOB: [birth year], 42 y.o.   MRN: [id number]  HPI  Here to f/u; overall doing ok,  Pt denies chest pain, increased sob or doe, wheezing, orthopnea, PND, increased LE swelling, palpitations, dizziness or syncope.  Pt denies polydipsia, polyuria, or low sugar symptoms such as weakness or confusion improved with po intake.  Pt denies new neurological symptoms such as new headache, or facial or extremity weakness or numbness.   Pt states overall good compliance with meds, has been trying to follow lower cholesterol, diabetic diet, with wt overall stable,  but little exercise however.  Not taking asa recently.  Has taken lisiopril in the past without cough.  Does have some mild GI upset with taking 2 of the metformin. Denies worsening reflux, abd pain, dysphagia, n/v, bowel change or blood. Past Medical History  Diagnosis Date  . Impaired glucose tolerance 03/08/2011  . ALLERGIC RHINITIS 04/07/2007  . ASTHMA 04/07/2007  . CONSTIPATION 10/29/2009  . GERD 04/07/2007  . HEMATOCHEZIA 10/29/2009  . HYPERTENSION 04/01/2007  . LOW BACK PAIN 04/07/2007  . Morbid obesity 04/07/2007   No past surgical history on file.  reports that he has been smoking.  He does not have any smokeless tobacco history on file. He reports that  drinks alcohol. He reports that he does not use illicit drugs. family history includes Diabetes in his other; Prostate cancer in his other; Stroke in his other; and Throat cancer in his other. No Known Allergies Current Outpatient Prescriptions on File Prior to Visit  Medication Sig Dispense Refill  . aspirin 81 MG tablet Take 81 mg by mouth daily.        . ranitidine (ZANTAC) 150 MG tablet Take 150 mg by mouth daily.         No current facility-administered medications on file prior to visit.   Review of Systems  Constitutional: Negative for unexpected weight change, or unusual diaphoresis  HENT: Negative for tinnitus.   Eyes: Negative for  photophobia and visual disturbance.  Respiratory: Negative for choking and stridor.   Gastrointestinal: Negative for vomiting and blood in stool.  Genitourinary: Negative for hematuria and decreased urine volume.  Musculoskeletal: Negative for acute joint swelling Skin: Negative for color change and wound.  Neurological: Negative for tremors and numbness other than noted  Psychiatric/Behavioral: Negative for decreased concentration or  hyperactivity.       Objective:   Physical Exam BP 120/80  Pulse 68  Temp(Src) 98.7 F (37.1 C) (Oral)  Ht 5\' 9"  (1.753 m)  Wt 281 lb 8 oz (127.688 kg)  BMI 41.55 kg/m2  SpO2 95% VS noted,  Constitutional: Pt appears well-developed and well-nourished.  HENT: Head: NCAT.  Right Ear: External ear normal.  Left Ear: External ear normal.  Eyes: Conjunctivae and EOM are normal. Pupils are equal, round, and reactive to light.  Neck: Normal range of motion. Neck supple.  Cardiovascular: Normal rate and regular rhythm.   Pulmonary/Chest: Effort normal and breath sounds normal.  Abd:  Soft, NT, non-distended, + BS Neurological: Pt is alert. Not confused  Skin: Skin is warm. No erythema.  Psychiatric: Pt behavior is normal. Thought content normal.      Assessment & Plan:

## 2013-01-25 NOTE — Assessment & Plan Note (Signed)
To start renoprotective lisinopril 5 qd, also asa 81 mg qd

## 2013-01-25 NOTE — Patient Instructions (Signed)
OK to decrease the metformin to 1 pill per day Please take all new medication as prescribed - the lisinopril 5 mg per day (very low dose) Please re-start the Aspirin 81 mg per day (enteric coated only) Your glucometer, strips, and lancets were sent to the  Pharmacy Please continue all other medications as before, and refills have been done if requested. Please have the pharmacy call with any other refills you may need. Please continue your efforts at being more active, low cholesterol diet, and weight control.  Thank you for enrolling in MyChart. Please follow the instructions below to securely access your online medical record. MyChart allows you to send messages to your doctor, view your test results, renew your prescriptions, schedule appointments, and more.  Please return in 6 months, or sooner if needed, with Lab testing done 3-5 days before

## 2013-01-25 NOTE — Assessment & Plan Note (Signed)
stable overall by history and exam, recent data reviewed with pt, and pt to continue medical treatment as before,  to f/u any worsening symptoms or concerns Lab Results  Component Value Date   WBC 7.1 10/24/2012   HGB 15.8 10/24/2012   HCT 46.6 10/24/2012   PLT 247.0 10/24/2012   GLUCOSE 95 01/24/2013   CHOL 104 01/24/2013   TRIG 82.0 01/24/2013   HDL 32.80* 01/24/2013   LDLCALC 55 01/24/2013   ALT 50 01/24/2013   AST 25 01/24/2013   NA 140 01/24/2013   K 4.3 01/24/2013   CL 107 01/24/2013   CREATININE 1.0 01/24/2013   BUN 12 01/24/2013   CO2 25 01/24/2013   TSH 1.23 10/24/2012   PSA 0.89 10/24/2012   HGBA1C 5.4 01/24/2013

## 2013-01-25 NOTE — Assessment & Plan Note (Addendum)
Much improved with diet and good med compliance, several lbs wt loss, though still not very active and was not able to attend the DM classes..  With some GI upset and a1c now so good, ok to decrase the metformiin to 1 qd, f/u next visit  Lab Results  Component Value Date   HGBA1C 5.4 01/24/2013

## 2013-03-10 ENCOUNTER — Telehealth: Payer: Self-pay | Admitting: *Deleted

## 2013-03-10 ENCOUNTER — Other Ambulatory Visit: Payer: Self-pay | Admitting: *Deleted

## 2013-03-10 ENCOUNTER — Encounter: Payer: Self-pay | Admitting: *Deleted

## 2013-03-10 DIAGNOSIS — C61 Malignant neoplasm of prostate: Secondary | ICD-10-CM

## 2013-03-10 MED ORDER — FENTANYL 25 MCG/HR TD PT72: 1.0000 | MEDICATED_PATCH | TRANSDERMAL | Status: DC

## 2013-03-10 MED ORDER — FENTANYL 50 MCG/HR TD PT72: 1.0000 | MEDICATED_PATCH | TRANSDERMAL | Status: DC

## 2013-03-10 NOTE — Telephone Encounter (Deleted)
Wife calling to say patient's patches not helping with his pain, c/o increased pain in his neck. Per kristin curcio np, we can give script for 25 mcg to be added to the 50 mcg patch to  = 75 mcg. Wife will p/u script, left at front.

## 2013-03-10 NOTE — Telephone Encounter (Signed)
Opened chart by mistake.

## 2013-03-10 NOTE — Telephone Encounter (Signed)
Wife picked up script for 25 mcg patch to add to his 50 mcg patch, for c/o pain, per kristin curcio np.

## 2013-03-28 ENCOUNTER — Other Ambulatory Visit: Payer: Self-pay | Admitting: *Deleted

## 2013-04-25 ENCOUNTER — Other Ambulatory Visit (INDEPENDENT_AMBULATORY_CARE_PROVIDER_SITE_OTHER): Payer: BC Managed Care – PPO

## 2013-04-25 DIAGNOSIS — Z0000 Encounter for general adult medical examination without abnormal findings: Secondary | ICD-10-CM

## 2013-04-25 DIAGNOSIS — IMO0001 Reserved for inherently not codable concepts without codable children: Secondary | ICD-10-CM

## 2013-04-25 LAB — BASIC METABOLIC PANEL
BUN: 11 mg/dL (ref 6–23)
Chloride: 106 mEq/L (ref 96–112)
GFR: 114.04 mL/min (ref >60.00)
Glucose, Bld: 114 mg/dL — ABNORMAL HIGH (ref 70–99)
Potassium: 4.4 mEq/L (ref 3.5–5.1)
Sodium: 140 mEq/L (ref 135–145)

## 2013-04-25 LAB — CBC WITH DIFFERENTIAL/PLATELET
Basophils Absolute: 0 10*3/uL (ref 0.0–0.1)
HCT: 43.8 % (ref 39.0–52.0)
Lymphs Abs: 2.6 10*3/uL (ref 0.7–4.0)
MCV: 86.8 fl (ref 78.0–100.0)
Monocytes Absolute: 0.8 10*3/uL (ref 0.1–1.0)
Neutrophils Relative %: 44.4 % (ref 43.0–77.0)
Platelets: 255 10*3/uL (ref 150.0–400.0)
RDW: 12.2 % (ref 11.5–14.6)
WBC: 6.4 10*3/uL (ref 4.5–10.5)

## 2013-04-25 LAB — URINALYSIS, ROUTINE W REFLEX MICROSCOPIC
Bilirubin Urine: NEGATIVE
Hgb urine dipstick: NEGATIVE
Ketones, ur: NEGATIVE
Leukocytes, UA: NEGATIVE
Nitrite: NEGATIVE
Urobilinogen, UA: 0.2 (ref 0.0–1.0)

## 2013-04-25 LAB — MICROALBUMIN / CREATININE URINE RATIO
Creatinine,U: 185.5 mg/dL
Microalb, Ur: 0.3 mg/dL (ref 0.0–1.9)

## 2013-04-25 LAB — HEPATIC FUNCTION PANEL
ALT: 32 U/L (ref 0–53)
AST: 22 U/L (ref 0–37)
Albumin: 4.1 g/dL (ref 3.5–5.2)
Alkaline Phosphatase: 55 U/L (ref 39–117)
Total Bilirubin: 0.7 mg/dL (ref 0.3–1.2)

## 2013-04-25 LAB — LIPID PANEL
Cholesterol: 120 mg/dL (ref 0–200)
VLDL: 24 mg/dL (ref 0.0–40.0)

## 2013-04-25 LAB — TSH: TSH: 0.8 u[IU]/mL (ref 0.35–5.50)

## 2013-04-25 LAB — HEMOGLOBIN A1C: Hgb A1c MFr Bld: 4.8 % (ref 4.6–6.5)

## 2013-04-26 ENCOUNTER — Encounter: Payer: Self-pay | Admitting: Internal Medicine

## 2013-04-26 ENCOUNTER — Ambulatory Visit (INDEPENDENT_AMBULATORY_CARE_PROVIDER_SITE_OTHER): Payer: BC Managed Care – PPO | Admitting: Internal Medicine

## 2013-04-26 VITALS — BP 120/70 | HR 108 | Temp 98.6°F | Ht 68.5 in | Wt 276.5 lb

## 2013-04-26 DIAGNOSIS — J45909 Unspecified asthma, uncomplicated: Secondary | ICD-10-CM

## 2013-04-26 DIAGNOSIS — Z0000 Encounter for general adult medical examination without abnormal findings: Secondary | ICD-10-CM

## 2013-04-26 MED ORDER — LISINOPRIL 5 MG PO TABS: 5.0000 mg | ORAL_TABLET | Freq: Every day | ORAL | Status: DC

## 2013-04-26 MED ORDER — ALBUTEROL SULFATE HFA 108 (90 BASE) MCG/ACT IN AERS: 2.0000 | INHALATION_SPRAY | Freq: Four times a day (QID) | RESPIRATORY_TRACT | Status: DC | PRN

## 2013-04-26 MED ORDER — METFORMIN HCL ER 500 MG PO TB24: 500.0000 mg | ORAL_TABLET | Freq: Every day | ORAL | Status: DC

## 2013-04-26 NOTE — Progress Notes (Signed)
Subjective:    Patient ID: Jerome [name], male    DOB: 01-Mar-1971, 42 y.o.   MRN: [id number]  HPI  Here for wellness and f/u;  Overall doing ok;  Pt denies CP, worsening SOB, DOE, wheezing, orthopnea, PND, worsening LE edema, palpitations, dizziness or syncope, except for mild wheeze/cough intermittent in the spring and fall, now out of his inhlaer as well.  Pt denies neurological change such as new headache, facial or extremity weakness.  Pt denies polydipsia, polyuria, or low sugar symptoms. Pt states overall good compliance with treatment and medications, good tolerability, and has been trying to follow lower cholesterol diet.  Pt denies worsening depressive symptoms, suicidal ideation or panic. No fever, night sweats, wt loss, loss of appetite, or other constitutional symptoms.  Pt states good ability with ADL's, has low fall risk, home safety reviewed and adequate, no other significant changes in hearing or vision, and only occasionally active with exercise - plans to try to do better, but increased stress recently with father in hosp with met prostate ca Past Medical History  Diagnosis Date  . Impaired glucose tolerance 03/08/2011  . ALLERGIC RHINITIS 04/07/2007  . ASTHMA 04/07/2007  . CONSTIPATION 10/29/2009  . GERD 04/07/2007  . HEMATOCHEZIA 10/29/2009  . HYPERTENSION 04/01/2007  . LOW BACK PAIN 04/07/2007  . Morbid obesity 04/07/2007   No past surgical history on file.  reports that he has been smoking.  He does not have any smokeless tobacco history on file. He reports that  drinks alcohol. He reports that he does not use illicit drugs. family history includes Diabetes in his other; Prostate cancer in his other; Stroke in his other; Throat cancer in his other. No Known Allergies Current Outpatient Prescriptions on File Prior to Visit  Medication Sig Dispense Refill  . aspirin 81 MG tablet Take 81 mg by mouth daily.        Marland Kitchen glucose blood test strip Use as instructed  100 each  12  . glucose  monitoring kit (FREESTYLE) monitoring kit 1 each by Does not apply route as needed for other.  1 each  0  . Lancets MISC Use as directed 1 per day for cbg check  100 each  12  . lisinopril (PRINIVIL,ZESTRIL) 5 MG tablet Take 1 tablet (5 mg total) by mouth daily.  90 tablet  3  . metFORMIN (GLUCOPHAGE-XR) 500 MG 24 hr tablet Take 1 tablet (500 mg total) by mouth daily with breakfast.  180 tablet  3  . ranitidine (ZANTAC) 150 MG tablet Take 150 mg by mouth daily.         No current facility-administered medications on file prior to visit.    Review of Systems Constitutional: Negative for diaphoresis, activity change, appetite change or unexpected weight change.  HENT: Negative for hearing loss, ear pain, facial swelling, mouth sores and neck stiffness.   Eyes: Negative for pain, redness and visual disturbance.  Respiratory: Negative for shortness of breath and wheezing.   Cardiovascular: Negative for chest pain and palpitations.  Gastrointestinal: Negative for diarrhea, blood in stool, abdominal distention or other pain Genitourinary: Negative for hematuria, flank pain or change in urine volume.  Musculoskeletal: Negative for myalgias and joint swelling.  Skin: Negative for color change and wound.  Neurological: Negative for syncope and numbness. other than noted Hematological: Negative for adenopathy.  Psychiatric/Behavioral: Negative for hallucinations, self-injury, decreased concentration and agitation.      Objective:   Physical Exam BP 120/70  Pulse 108  Temp(Src) 98.6  F (37 C) (Oral)  Ht 5' 8.5" (1.74 m)  Wt 276 lb 8 oz (125.42 kg)  BMI 41.43 kg/m2  SpO2 97% VS noted,  Constitutional: Pt is oriented to person, place, and time./obese Appears well-developed and well-nourished.  Head: Normocephalic and atraumatic.  Right Ear: External ear normal.  Left Ear: External ear normal.  Nose: Nose normal.  Mouth/Throat: Oropharynx is clear and moist.  Eyes: Conjunctivae and EOM are  normal. Pupils are equal, round, and reactive to light.  Neck: Normal range of motion. Neck supple. No JVD present. No tracheal deviation present.  Cardiovascular: Normal rate, regular rhythm, normal heart sounds and intact distal pulses.   Pulmonary/Chest: Effort normal and breath sounds normal.  Abdominal: Soft. Bowel sounds are normal. There is no tenderness. No HSM  Musculoskeletal: Normal range of motion. Exhibits no edema.  Lymphadenopathy:  Has no cervical adenopathy.  Neurological: Pt is alert and oriented to person, place, and time. Pt has normal reflexes. No cranial nerve deficit.  Skin: Skin is warm and dry. No rash noted.  Psychiatric:  Has somewhat dysphoric mood and affect. Behavior is normal.     Assessment & Plan:

## 2013-04-26 NOTE — Assessment & Plan Note (Signed)

## 2013-04-26 NOTE — Assessment & Plan Note (Signed)
stable overall by history and exam, recent data reviewed with pt, and pt to continue medical treatment as before,  to f/u any worsening symptoms or concerns Lab Results  Component Value Date   HGBA1C 4.8 04/25/2013   F/u 6 mo labs

## 2013-04-26 NOTE — Assessment & Plan Note (Signed)
Mild uncontrolled seasonal, for inhaler refill, add singulari 10 qd

## 2013-04-26 NOTE — Patient Instructions (Addendum)
Please take all new medication as prescribed  - the singulair for the allergies/asthma/cough Please continue all other medications as before, and refills have been done if requested including the inhaler Please continue your efforts at being more active, low cholesterol diet, and weight control. You are otherwise up to date with prevention measures today.  Please remember to sign up for My Chart if you have not done so, as this will be important to you in the future with finding out test results, communicating by private email, and scheduling acute appointments online when needed.  Please return in 6 months, or sooner if needed, with Lab testing done 3-5 days before

## 2013-06-22 ENCOUNTER — Other Ambulatory Visit: Payer: Self-pay

## 2013-07-27 ENCOUNTER — Ambulatory Visit: Payer: BC Managed Care – PPO | Admitting: Internal Medicine

## 2013-10-24 ENCOUNTER — Other Ambulatory Visit (INDEPENDENT_AMBULATORY_CARE_PROVIDER_SITE_OTHER): Payer: BC Managed Care – PPO

## 2013-10-24 DIAGNOSIS — IMO0001 Reserved for inherently not codable concepts without codable children: Secondary | ICD-10-CM

## 2013-10-24 DIAGNOSIS — E1165 Type 2 diabetes mellitus with hyperglycemia: Principal | ICD-10-CM

## 2013-10-24 LAB — HEPATIC FUNCTION PANEL
ALBUMIN: 4.2 g/dL (ref 3.5–5.2)
ALT: 40 U/L (ref 0–53)
AST: 28 U/L (ref 0–37)
Alkaline Phosphatase: 48 U/L (ref 39–117)
Bilirubin, Direct: 0.2 mg/dL (ref 0.0–0.3)
TOTAL PROTEIN: 7.3 g/dL (ref 6.0–8.3)
Total Bilirubin: 0.8 mg/dL (ref 0.3–1.2)

## 2013-10-24 LAB — LIPID PANEL
CHOL/HDL RATIO: 3
CHOLESTEROL: 115 mg/dL (ref 0–200)
HDL: 41.6 mg/dL (ref >39.00)
LDL Cholesterol: 51 mg/dL (ref 0–99)
TRIGLYCERIDES: 110 mg/dL (ref 0.0–149.0)
VLDL: 22 mg/dL (ref 0.0–40.0)

## 2013-10-24 LAB — BASIC METABOLIC PANEL
BUN: 12 mg/dL (ref 6–23)
CALCIUM: 9.5 mg/dL (ref 8.4–10.5)
CO2: 27 meq/L (ref 19–32)
CREATININE: 1 mg/dL (ref 0.4–1.5)
Chloride: 108 mEq/L (ref 96–112)
GFR: 105.86 mL/min (ref >60.00)
GLUCOSE: 95 mg/dL (ref 70–99)
Potassium: 4.1 mEq/L (ref 3.5–5.1)
Sodium: 140 mEq/L (ref 135–145)

## 2013-10-24 LAB — HEMOGLOBIN A1C: HEMOGLOBIN A1C: 5.1 % (ref 4.6–6.5)

## 2013-10-26 ENCOUNTER — Encounter: Payer: Self-pay | Admitting: Internal Medicine

## 2013-10-26 ENCOUNTER — Ambulatory Visit (INDEPENDENT_AMBULATORY_CARE_PROVIDER_SITE_OTHER): Payer: BC Managed Care – PPO | Admitting: Internal Medicine

## 2013-10-26 VITALS — BP 120/70 | HR 102 | Temp 98.3°F | Ht 69.0 in | Wt 279.0 lb

## 2013-10-26 DIAGNOSIS — E1165 Type 2 diabetes mellitus with hyperglycemia: Principal | ICD-10-CM

## 2013-10-26 DIAGNOSIS — J45909 Unspecified asthma, uncomplicated: Secondary | ICD-10-CM

## 2013-10-26 DIAGNOSIS — I10 Essential (primary) hypertension: Secondary | ICD-10-CM

## 2013-10-26 DIAGNOSIS — Z0000 Encounter for general adult medical examination without abnormal findings: Secondary | ICD-10-CM

## 2013-10-26 DIAGNOSIS — IMO0001 Reserved for inherently not codable concepts without codable children: Secondary | ICD-10-CM

## 2013-10-26 DIAGNOSIS — K219 Gastro-esophageal reflux disease without esophagitis: Secondary | ICD-10-CM

## 2013-10-26 NOTE — Patient Instructions (Signed)
Please continue all other medications as before, and refills have been done if requested. Please have the pharmacy call with any other refills you may need.  Please continue your efforts at being more active, low cholesterol diet, and weight control.  Please remember to sign up for MyChart if you have not done so, as this will be important to you in the future with finding out test results, communicating by private email, and scheduling acute appointments online when needed.  Please return in 6 months, or sooner if needed, with Lab testing done 3-5 days before

## 2013-10-26 NOTE — Assessment & Plan Note (Signed)
stable overall by history and exam, recent data reviewed with pt, and pt to continue medical treatment as before,  to f/u any worsening symptoms or concerns BP Readings from Last 3 Encounters:  10/26/13 120/70  04/26/13 120/70  01/25/13 120/80

## 2013-10-26 NOTE — Assessment & Plan Note (Signed)
stable overall by history and exam, recent data reviewed with pt, and pt to continue medical treatment as before,  to f/u any worsening symptoms or concerns Lab Results  Component Value Date   HGBA1C 5.1 10/24/2013

## 2013-10-26 NOTE — Assessment & Plan Note (Signed)
stable overall by history and exam, recent data reviewed with pt, and pt to continue medical treatment as before,  to f/u any worsening symptoms or concerns Lab Results  Component Value Date   WBC 6.4 04/25/2013   HGB 14.7 04/25/2013   HCT 43.8 04/25/2013   PLT 255.0 04/25/2013   GLUCOSE 95 10/24/2013   CHOL 115 10/24/2013   TRIG 110.0 10/24/2013   HDL 41.60 10/24/2013   LDLCALC 51 10/24/2013   ALT 40 10/24/2013   AST 28 10/24/2013   NA 140 10/24/2013   K 4.1 10/24/2013   CL 108 10/24/2013   CREATININE 1.0 10/24/2013   BUN 12 10/24/2013   CO2 27 10/24/2013   TSH 0.80 04/25/2013   PSA 1.29 04/25/2013   HGBA1C 5.1 10/24/2013   MICROALBUR 0.3 04/25/2013

## 2013-10-26 NOTE — Progress Notes (Signed)
Pre visit review using our clinic review tool, if applicable. No additional management support is needed unless otherwise documented below in the visit note. 

## 2013-10-26 NOTE — Progress Notes (Signed)
Subjective:    Patient ID: Jerome [name], male    DOB: Dec 25, 1970, 43 y.o.   MRN: [id number]  HPI  Here to f/u; overall doing ok,  Pt denies chest pain, increased sob or doe, wheezing, orthopnea, PND, increased LE swelling, palpitations, dizziness or syncope.  Pt denies polydipsia, polyuria, or low sugar symptoms such as weakness or confusion improved with po intake.  Pt denies new neurological symptoms such as new headache, or facial or extremity weakness or numbness.   Pt states overall good compliance with meds, has been trying to follow lower cholesterol, diabetic diet, with wt overall stable,  Going to the gym several times per wk in the past month, hard to lose wt.  Denies worsening reflux, abd pain, dysphagia, n/v, bowel change or blood. Past Medical History  Diagnosis Date  . Impaired glucose tolerance 03/08/2011  . ALLERGIC RHINITIS 04/07/2007  . ASTHMA 04/07/2007  . CONSTIPATION 10/29/2009  . GERD 04/07/2007  . HEMATOCHEZIA 10/29/2009  . HYPERTENSION 04/01/2007  . LOW BACK PAIN 04/07/2007  . Morbid obesity 04/07/2007   No past surgical history on file.  reports that he has been smoking.  He does not have any smokeless tobacco history on file. He reports that he drinks alcohol. He reports that he does not use illicit drugs. family history includes Diabetes in his other; Prostate cancer in his other; Stroke in his other; Throat cancer in his other. No Known Allergies Current Outpatient Prescriptions on File Prior to Visit  Medication Sig Dispense Refill  . albuterol (PROVENTIL HFA;VENTOLIN HFA) 108 (90 BASE) MCG/ACT inhaler Inhale 2 puffs into the lungs every 6 (six) hours as needed for wheezing.  1 Inhaler  11  . aspirin 81 MG tablet Take 81 mg by mouth daily.        Marland Kitchen glucose blood test strip Use as instructed  100 each  12  . glucose monitoring kit (FREESTYLE) monitoring kit 1 each by Does not apply route as needed for other.  1 each  0  . Lancets MISC Use as directed 1 per day for cbg  check  100 each  12  . lisinopril (PRINIVIL,ZESTRIL) 5 MG tablet Take 1 tablet (5 mg total) by mouth daily.  90 tablet  3  . metFORMIN (GLUCOPHAGE-XR) 500 MG 24 hr tablet Take 1 tablet (500 mg total) by mouth daily with breakfast.  90 tablet  3   No current facility-administered medications on file prior to visit.   Review of Systems  Constitutional: Negative for unexpected weight change, or unusual diaphoresis  HENT: Negative for tinnitus.   Eyes: Negative for photophobia and visual disturbance.  Respiratory: Negative for choking and stridor.   Gastrointestinal: Negative for vomiting and blood in stool.  Genitourinary: Negative for hematuria and decreased urine volume.  Musculoskeletal: Negative for acute joint swelling Skin: Negative for color change and wound.  Neurological: Negative for tremors and numbness other than noted  Psychiatric/Behavioral: Negative for decreased concentration or  hyperactivity.       Objective:   Physical Exam BP 120/70  Pulse 102  Temp(Src) 98.3 F (36.8 C) (Oral)  Ht 5' 9" (1.753 m)  Wt 279 lb (126.554 kg)  BMI 41.18 kg/m2  SpO2 94% VS noted,  Constitutional: Pt appears well-developed and well-nourished.  HENT: Head: NCAT.  Right Ear: External ear normal.  Left Ear: External ear normal.  Eyes: Conjunctivae and EOM are normal. Pupils are equal, round, and reactive to light.  Neck: Normal range of motion. Neck  supple.  Cardiovascular: Normal rate and regular rhythm.   Pulmonary/Chest: Effort normal and breath sounds normal.  Neurological: Pt is alert. Not confused  Skin: Skin is warm. No erythema.  Psychiatric: Pt behavior is normal. Thought content normal.         Assessment & Plan:

## 2013-10-26 NOTE — Assessment & Plan Note (Signed)
stable overall by history and exam, recent data reviewed with pt, and pt to continue medical treatment as before,  to f/u any worsening symptoms or concerns SpO2 Readings from Last 3 Encounters:  10/26/13 94%  04/26/13 97%  01/25/13 95%

## 2013-11-02 ENCOUNTER — Telehealth: Payer: Self-pay

## 2013-11-02 NOTE — Telephone Encounter (Signed)
Relevant patient education assigned to patient using Emmi. ° °

## 2014-04-25 ENCOUNTER — Other Ambulatory Visit (INDEPENDENT_AMBULATORY_CARE_PROVIDER_SITE_OTHER): Payer: BC Managed Care – PPO

## 2014-04-25 DIAGNOSIS — IMO0001 Reserved for inherently not codable concepts without codable children: Secondary | ICD-10-CM

## 2014-04-25 DIAGNOSIS — Z0000 Encounter for general adult medical examination without abnormal findings: Secondary | ICD-10-CM

## 2014-04-25 DIAGNOSIS — E1165 Type 2 diabetes mellitus with hyperglycemia: Principal | ICD-10-CM

## 2014-04-25 LAB — URINALYSIS, ROUTINE W REFLEX MICROSCOPIC
BILIRUBIN URINE: NEGATIVE
Hgb urine dipstick: NEGATIVE
KETONES UR: NEGATIVE
Leukocytes, UA: NEGATIVE
NITRITE: NEGATIVE
RBC / HPF: NONE SEEN (ref 0–?)
SPECIFIC GRAVITY, URINE: 1.01 (ref 1.000–1.030)
Total Protein, Urine: NEGATIVE
UROBILINOGEN UA: 0.2 (ref 0.0–1.0)
Urine Glucose: NEGATIVE
pH: 5.5 (ref 5.0–8.0)

## 2014-04-25 LAB — HEPATIC FUNCTION PANEL
ALK PHOS: 60 U/L (ref 39–117)
ALT: 34 U/L (ref 0–53)
AST: 23 U/L (ref 0–37)
Albumin: 3.9 g/dL (ref 3.5–5.2)
Bilirubin, Direct: 0.1 mg/dL (ref 0.0–0.3)
TOTAL PROTEIN: 7.1 g/dL (ref 6.0–8.3)
Total Bilirubin: 0.6 mg/dL (ref 0.2–1.2)

## 2014-04-25 LAB — HEMOGLOBIN A1C: HEMOGLOBIN A1C: 5.1 % (ref 4.6–6.5)

## 2014-04-25 LAB — BASIC METABOLIC PANEL
BUN: 11 mg/dL (ref 6–23)
CHLORIDE: 105 meq/L (ref 96–112)
CO2: 27 mEq/L (ref 19–32)
Calcium: 9.4 mg/dL (ref 8.4–10.5)
Creatinine, Ser: 1 mg/dL (ref 0.4–1.5)
GFR: 109.43 mL/min (ref >60.00)
Glucose, Bld: 112 mg/dL — ABNORMAL HIGH (ref 70–99)
Potassium: 4.4 mEq/L (ref 3.5–5.1)
Sodium: 138 mEq/L (ref 135–145)

## 2014-04-25 LAB — LIPID PANEL
CHOL/HDL RATIO: 3
Cholesterol: 125 mg/dL (ref 0–200)
HDL: 40.8 mg/dL (ref >39.00)
LDL Cholesterol: 66 mg/dL (ref 0–99)
NONHDL: 84.2
Triglycerides: 93 mg/dL (ref 0.0–149.0)
VLDL: 18.6 mg/dL (ref 0.0–40.0)

## 2014-04-25 LAB — CBC WITH DIFFERENTIAL/PLATELET
BASOS ABS: 0 10*3/uL (ref 0.0–0.1)
Basophils Relative: 0.7 % (ref 0.0–3.0)
Eosinophils Absolute: 0.2 10*3/uL (ref 0.0–0.7)
Eosinophils Relative: 3.1 % (ref 0.0–5.0)
HCT: 43.5 % (ref 39.0–52.0)
Hemoglobin: 14.2 g/dL (ref 13.0–17.0)
LYMPHS ABS: 2.7 10*3/uL (ref 0.7–4.0)
Lymphocytes Relative: 37.1 % (ref 12.0–46.0)
MCHC: 32.7 g/dL (ref 30.0–36.0)
MCV: 86.9 fl (ref 78.0–100.0)
Monocytes Absolute: 0.6 10*3/uL (ref 0.1–1.0)
Monocytes Relative: 8 % (ref 3.0–12.0)
Neutro Abs: 3.8 10*3/uL (ref 1.4–7.7)
Neutrophils Relative %: 51.1 % (ref 43.0–77.0)
PLATELETS: 288 10*3/uL (ref 150.0–400.0)
RBC: 5.01 Mil/uL (ref 4.22–5.81)
RDW: 11.9 % (ref 11.5–15.5)
WBC: 7.4 10*3/uL (ref 4.0–10.5)

## 2014-04-25 LAB — PSA: PSA: 1.69 ng/mL (ref 0.10–4.00)

## 2014-04-25 LAB — MICROALBUMIN / CREATININE URINE RATIO
Creatinine,U: 84.1 mg/dL
MICROALB/CREAT RATIO: 0.4 mg/g (ref 0.0–30.0)
Microalb, Ur: 0.3 mg/dL (ref 0.0–1.9)

## 2014-04-25 LAB — TSH: TSH: 1.01 u[IU]/mL (ref 0.35–4.50)

## 2014-04-26 ENCOUNTER — Ambulatory Visit (INDEPENDENT_AMBULATORY_CARE_PROVIDER_SITE_OTHER): Payer: BC Managed Care – PPO | Admitting: Internal Medicine

## 2014-04-26 ENCOUNTER — Encounter: Payer: Self-pay | Admitting: Internal Medicine

## 2014-04-26 VITALS — BP 122/70 | HR 77 | Temp 98.7°F | Ht 68.5 in | Wt 286.5 lb

## 2014-04-26 DIAGNOSIS — IMO0001 Reserved for inherently not codable concepts without codable children: Secondary | ICD-10-CM

## 2014-04-26 DIAGNOSIS — R972 Elevated prostate specific antigen [PSA]: Secondary | ICD-10-CM | POA: Insufficient documentation

## 2014-04-26 DIAGNOSIS — E1165 Type 2 diabetes mellitus with hyperglycemia: Secondary | ICD-10-CM

## 2014-04-26 DIAGNOSIS — Z0000 Encounter for general adult medical examination without abnormal findings: Secondary | ICD-10-CM

## 2014-04-26 NOTE — Assessment & Plan Note (Signed)
stable overall by history and exam, recent data reviewed with pt, and pt to continue medical treatment as before,  to f/u any worsening symptoms or concerns Lab Results  Component Value Date   HGBA1C 5.1 04/25/2014   For f/u next visit

## 2014-04-26 NOTE — Patient Instructions (Addendum)
Please continue all other medications as before, and refills have been done if requested.  Please have the pharmacy call with any other refills you may need.  Please continue your efforts at being more active, low cholesterol diet, and weight control.  You are otherwise up to date with prevention measures today.  Please keep your appointments with your specialists as you may have planned  Your lab work was OK today  Please return in 6 months, or sooner if needed, with Lab testing done 3-5 days before

## 2014-04-26 NOTE — Assessment & Plan Note (Signed)

## 2014-04-26 NOTE — Assessment & Plan Note (Signed)
With mild incr psa velicty, for recheck next visit

## 2014-04-26 NOTE — Progress Notes (Signed)
Subjective:    Patient ID: Jerome [name], male    DOB: 1970-11-05, 43 y.o.   MRN: [id number]  HPI  Here for wellness and f/u;  Overall doing ok;  Pt denies CP, worsening SOB, DOE, wheezing, orthopnea, PND, worsening LE edema, palpitations, dizziness or syncope.  Pt denies neurological change such as new headache, facial or extremity weakness.  Pt denies polydipsia, polyuria, or low sugar symptoms. Pt states overall good compliance with treatment and medications, good tolerability, and has been trying to follow lower cholesterol diet.  Pt denies worsening depressive symptoms, suicidal ideation or panic. No fever, night sweats, wt loss, loss of appetite, or other constitutional symptoms.  Pt states good ability with ADL's, has low fall risk, home safety reviewed and adequate, no other significant changes in hearing or vision, and occasionally active with exercise, with walking daily. No current complaints Sees optho at least yearly. Gained several lbs recnetly, but plans to lose this winter. Past Medical History  Diagnosis Date  . Impaired glucose tolerance 03/08/2011  . ALLERGIC RHINITIS 04/07/2007  . ASTHMA 04/07/2007  . CONSTIPATION 10/29/2009  . GERD 04/07/2007  . HEMATOCHEZIA 10/29/2009  . HYPERTENSION 04/01/2007  . LOW BACK PAIN 04/07/2007  . Morbid obesity 04/07/2007   No past surgical history on file.  reports that he has been smoking.  He does not have any smokeless tobacco history on file. He reports that he drinks alcohol. He reports that he does not use illicit drugs. family history includes Diabetes in his other; Prostate cancer in his other; Stroke in his other; Throat cancer in his other. No Known Allergies Current Outpatient Prescriptions on File Prior to Visit  Medication Sig Dispense Refill  . albuterol (PROVENTIL HFA;VENTOLIN HFA) 108 (90 BASE) MCG/ACT inhaler Inhale 2 puffs into the lungs every 6 (six) hours as needed for wheezing.  1 Inhaler  11  . aspirin 81 MG tablet Take 81 mg  by mouth daily.        Marland Kitchen glucose blood test strip Use as instructed  100 each  12  . glucose monitoring kit (FREESTYLE) monitoring kit 1 each by Does not apply route as needed for other.  1 each  0  . Lancets MISC Use as directed 1 per day for cbg check  100 each  12  . lisinopril (PRINIVIL,ZESTRIL) 5 MG tablet Take 1 tablet (5 mg total) by mouth daily.  90 tablet  3  . metFORMIN (GLUCOPHAGE-XR) 500 MG 24 hr tablet Take 1 tablet (500 mg total) by mouth daily with breakfast.  90 tablet  3   No current facility-administered medications on file prior to visit.    Review of Systems Constitutional: Negative for increased diaphoresis, other activity, appetite or other siginficant weight change  HENT: Negative for worsening hearing loss, ear pain, facial swelling, mouth sores and neck stiffness.   Eyes: Negative for other worsening pain, redness or visual disturbance.  Respiratory: Negative for shortness of breath and wheezing.   Cardiovascular: Negative for chest pain and palpitations.  Gastrointestinal: Negative for diarrhea, blood in stool, abdominal distention or other pain Genitourinary: Negative for hematuria, flank pain or change in urine volume.  Musculoskeletal: Negative for myalgias or other joint complaints.  Skin: Negative for color change and wound.  Neurological: Negative for syncope and numbness. other than noted Hematological: Negative for adenopathy. or other swelling Psychiatric/Behavioral: Negative for hallucinations, self-injury, decreased concentration or other worsening agitation.      Objective:   Physical Exam BP 122/70  Pulse 77  Temp(Src) 98.7 F (37.1 C) (Oral)  Ht 5' 8.5" (1.74 m)  Wt 286 lb 8 oz (129.956 kg)  BMI 42.92 kg/m2  SpO2 96% VS noted,  Constitutional: Pt is oriented to person, place, and time. Appears well-developed and well-nourished.  Head: Normocephalic and atraumatic.  Right Ear: External ear normal.  Left Ear: External ear normal.  Nose:  Nose normal.  Mouth/Throat: Oropharynx is clear and moist.  Eyes: Conjunctivae and EOM are normal. Pupils are equal, round, and reactive to light.  Neck: Normal range of motion. Neck supple. No JVD present. No tracheal deviation present.  Cardiovascular: Normal rate, regular rhythm, normal heart sounds and intact distal pulses.   Pulmonary/Chest: Effort normal and breath sounds without rales or wheezing  Abdominal: Soft. Bowel sounds are normal. NT. No HSM  Musculoskeletal: Normal range of motion. Exhibits no edema.  Lymphadenopathy:  Has no cervical adenopathy.  Neurological: Pt is alert and oriented to person, place, and time. Pt has normal reflexes. No cranial nerve deficit. Motor grossly intact Skin: Skin is warm and dry. No rash noted.  Psychiatric:  Has normal mood and affect. Behavior is normal.     Assessment & Plan:   Wt Readings from Last 3 Encounters:  04/26/14 286 lb 8 oz (129.956 kg)  10/26/13 279 lb (126.554 kg)  04/26/13 276 lb 8 oz (125.42 kg)

## 2014-04-26 NOTE — Progress Notes (Signed)
Pre visit review using our clinic review tool, if applicable. No additional management support is needed unless otherwise documented below in the visit note. 

## 2014-06-29 ENCOUNTER — Other Ambulatory Visit: Payer: Self-pay | Admitting: Internal Medicine

## 2014-09-11 ENCOUNTER — Other Ambulatory Visit: Payer: Self-pay | Admitting: Internal Medicine

## 2014-10-24 ENCOUNTER — Other Ambulatory Visit (INDEPENDENT_AMBULATORY_CARE_PROVIDER_SITE_OTHER): Payer: Self-pay

## 2014-10-24 DIAGNOSIS — R972 Elevated prostate specific antigen [PSA]: Secondary | ICD-10-CM

## 2014-10-24 DIAGNOSIS — IMO0002 Reserved for concepts with insufficient information to code with codable children: Secondary | ICD-10-CM

## 2014-10-24 DIAGNOSIS — E1165 Type 2 diabetes mellitus with hyperglycemia: Secondary | ICD-10-CM

## 2014-10-24 LAB — BASIC METABOLIC PANEL
BUN: 14 mg/dL (ref 6–23)
CO2: 28 mEq/L (ref 19–32)
CREATININE: 1.1 mg/dL (ref 0.40–1.50)
Calcium: 9.2 mg/dL (ref 8.4–10.5)
Chloride: 105 mEq/L (ref 96–112)
GFR: 93.31 mL/min (ref >60.00)
GLUCOSE: 111 mg/dL — AB (ref 70–99)
POTASSIUM: 4.7 meq/L (ref 3.5–5.1)
Sodium: 139 mEq/L (ref 135–145)

## 2014-10-24 LAB — LIPID PANEL
CHOLESTEROL: 83 mg/dL (ref 0–200)
HDL: 30.9 mg/dL — AB (ref >39.00)
LDL Cholesterol: 27 mg/dL (ref 0–99)
NonHDL: 52.1
TRIGLYCERIDES: 128 mg/dL (ref 0.0–149.0)
Total CHOL/HDL Ratio: 3
VLDL: 25.6 mg/dL (ref 0.0–40.0)

## 2014-10-24 LAB — HEPATIC FUNCTION PANEL
ALBUMIN: 4 g/dL (ref 3.5–5.2)
ALT: 35 U/L (ref 0–53)
AST: 26 U/L (ref 0–37)
Alkaline Phosphatase: 56 U/L (ref 39–117)
Bilirubin, Direct: 0.2 mg/dL (ref 0.0–0.3)
TOTAL PROTEIN: 7 g/dL (ref 6.0–8.3)
Total Bilirubin: 0.6 mg/dL (ref 0.2–1.2)

## 2014-10-24 LAB — HEMOGLOBIN A1C: Hgb A1c MFr Bld: 5.2 % (ref 4.6–6.5)

## 2014-10-24 LAB — PSA: PSA: 0.95 ng/mL (ref 0.10–4.00)

## 2014-10-25 ENCOUNTER — Encounter: Payer: Self-pay | Admitting: Internal Medicine

## 2014-10-25 ENCOUNTER — Ambulatory Visit (INDEPENDENT_AMBULATORY_CARE_PROVIDER_SITE_OTHER): Payer: BLUE CROSS/BLUE SHIELD | Admitting: Internal Medicine

## 2014-10-25 VITALS — HR 71 | Temp 98.5°F | Resp 18 | Wt 281.0 lb

## 2014-10-25 DIAGNOSIS — I10 Essential (primary) hypertension: Secondary | ICD-10-CM

## 2014-10-25 DIAGNOSIS — R05 Cough: Secondary | ICD-10-CM

## 2014-10-25 DIAGNOSIS — Z0189 Encounter for other specified special examinations: Secondary | ICD-10-CM

## 2014-10-25 DIAGNOSIS — E119 Type 2 diabetes mellitus without complications: Secondary | ICD-10-CM

## 2014-10-25 DIAGNOSIS — Z0000 Encounter for general adult medical examination without abnormal findings: Secondary | ICD-10-CM

## 2014-10-25 DIAGNOSIS — J4520 Mild intermittent asthma, uncomplicated: Secondary | ICD-10-CM

## 2014-10-25 DIAGNOSIS — R059 Cough, unspecified: Secondary | ICD-10-CM

## 2014-10-25 MED ORDER — AZITHROMYCIN 250 MG PO TABS: ORAL_TABLET | ORAL | Status: DC

## 2014-10-25 NOTE — Assessment & Plan Note (Signed)
stable overall by history and exam, recent data reviewed with pt, and pt to continue medical treatment as before,  to f/u any worsening symptoms or concerns Lab Results  Component Value Date   HGBA1C 5.2 10/24/2014

## 2014-10-25 NOTE — Patient Instructions (Signed)
Please take all new medication as prescribed - the antibiotic  Please continue all other medications as before, and refills have been done if requested.  Please have the pharmacy call with any other refills you may need.  Please continue your efforts at being more active, low cholesterol diet, and weight control.  Please keep your appointments with your specialists as you may have planned  Please return in 6 months, or sooner if needed, with Lab testing done 3-5 days before   

## 2014-10-25 NOTE — Progress Notes (Signed)
Subjective:    Patient ID: Jerome [name], male    DOB: Aug 30, 1970, 44 y.o.   MRN: [id number]  HPI   Here with acute onset mild to mod 2-3 days ST, HA, general weakness and malaise, with prod cough greenish sputum, but Pt denies chest pain, increased sob or doe, wheezing, orthopnea, PND, increased LE swelling, palpitations, dizziness or syncope. Has not had to increase his inhaler use.  Pt denies new neurological symptoms such as new headache, or facial or extremity weakness or numbness  Pt denies polydipsia, polyuria, or low sugar symptoms such as weakness or confusion improved with po intake.  Pt states overall good compliance with meds, trying to follow lower cholesterol, diabetic diet,   CBG's ta home in low 100's.  Has been more active and lost 5 lbs Wt Readings from Last 3 Encounters:  10/25/14 281 lb (127.461 kg)  04/26/14 286 lb 8 oz (129.956 kg)  10/26/13 279 lb (126.554 kg)   Past Medical History  Diagnosis Date  . Impaired glucose tolerance 03/08/2011  . ALLERGIC RHINITIS 04/07/2007  . ASTHMA 04/07/2007  . CONSTIPATION 10/29/2009  . GERD 04/07/2007  . HEMATOCHEZIA 10/29/2009  . HYPERTENSION 04/01/2007  . LOW BACK PAIN 04/07/2007  . Morbid obesity 04/07/2007   No past surgical history on file.  reports that he has been smoking.  He does not have any smokeless tobacco history on file. He reports that he drinks alcohol. He reports that he does not use illicit drugs. family history includes Diabetes in his other; Prostate cancer in his other; Stroke in his other; Throat cancer in his other. No Known Allergies Current Outpatient Prescriptions on File Prior to Visit  Medication Sig Dispense Refill  . albuterol (PROVENTIL HFA;VENTOLIN HFA) 108 (90 BASE) MCG/ACT inhaler Inhale 2 puffs into the lungs every 6 (six) hours as needed for wheezing. 1 Inhaler 11  . aspirin 81 MG tablet Take 81 mg by mouth daily.      Marland Kitchen glucose blood test strip Use as instructed 100 each 12  . glucose monitoring  kit (FREESTYLE) monitoring kit 1 each by Does not apply route as needed for other. 1 each 0  . Lancets MISC Use as directed 1 per day for cbg check 100 each 12  . lisinopril (PRINIVIL,ZESTRIL) 5 MG tablet TAKE 1 TABLET BY MOUTH DAILY 90 tablet 3  . metFORMIN (GLUCOPHAGE-XR) 500 MG 24 hr tablet TAKE 1 TABLET BY MOUTH DAILY WITH BREAKFAST 90 tablet 3   No current facility-administered medications on file prior to visit.     Review of Systems  Constitutional: Negative for unusual diaphoresis or night sweats HENT: Negative for ringing in ear or discharge Eyes: Negative for double vision or worsening visual disturbance.  Respiratory: Negative for choking and stridor.   Gastrointestinal: Negative for vomiting or other signifcant bowel change Genitourinary: Negative for hematuria or change in urine volume.  Musculoskeletal: Negative for other MSK pain or swelling Skin: Negative for color change and worsening wound.  Neurological: Negative for tremors and numbness other than noted  Psychiatric/Behavioral: Negative for decreased concentration or agitation other than above       Objective:   Physical Exam BP 132/80 mmHg  Pulse 71  Temp(Src) 98.5 F (36.9 C) (Oral)  Resp 18  Wt 281 lb (127.461 kg)  SpO2 93% VS noted,  Constitutional: Pt appears in no significant distress HENT: Head: NCAT.  Right Ear: External ear normal.  Left Ear: External ear normal.  Eyes: . Pupils are equal,  round, and reactive to light. Conjunctivae and EOM are normal Neck: Normal range of motion. Neck supple.  Cardiovascular: Normal rate and regular rhythm.   Pulmonary/Chest: Effort normal and breath sounds with few RLL rales, course BS, No wheezing.  Neurological: Pt is alert. Not confused , motor grossly intact Skin: Skin is warm. No rash, no LE edema Psychiatric: Pt behavior is normal. No agitation.     Assessment & Plan:

## 2014-10-25 NOTE — Assessment & Plan Note (Signed)
stable overall by history and exam, recent data reviewed with pt, and pt to continue medical treatment as before,  to f/u any worsening symptoms or concerns BP Readings from Last 3 Encounters:  04/26/14 122/70  10/26/13 120/70  04/26/13 120/70

## 2014-10-25 NOTE — Assessment & Plan Note (Signed)
stable overall by history and exam, recent data reviewed with pt, and pt to continue medical treatment as before,  to f/u any worsening symptoms or concerns SpO2 Readings from Last 3 Encounters:  10/25/14 93%  04/26/14 96%  10/26/13 94%

## 2014-10-25 NOTE — Progress Notes (Signed)
Pre visit review using our clinic review tool, if applicable. No additional management support is needed unless otherwise documented below in the visit note. 

## 2014-10-25 NOTE — Assessment & Plan Note (Signed)
Mostly c/w bronchitis, Mild to mod, for antibx course,  to f/u any worsening symptoms or concerns, declines cxr

## 2014-10-26 ENCOUNTER — Telehealth: Payer: Self-pay | Admitting: Internal Medicine

## 2014-10-26 NOTE — Telephone Encounter (Signed)
emmi emailed °

## 2015-05-01 ENCOUNTER — Other Ambulatory Visit (INDEPENDENT_AMBULATORY_CARE_PROVIDER_SITE_OTHER): Payer: BLUE CROSS/BLUE SHIELD

## 2015-05-01 ENCOUNTER — Other Ambulatory Visit: Payer: Self-pay | Admitting: Internal Medicine

## 2015-05-01 DIAGNOSIS — Z0189 Encounter for other specified special examinations: Secondary | ICD-10-CM | POA: Diagnosis not present

## 2015-05-01 DIAGNOSIS — E119 Type 2 diabetes mellitus without complications: Secondary | ICD-10-CM | POA: Diagnosis not present

## 2015-05-01 DIAGNOSIS — Z0000 Encounter for general adult medical examination without abnormal findings: Secondary | ICD-10-CM

## 2015-05-01 LAB — CBC WITH DIFFERENTIAL/PLATELET
Basophils Absolute: 0 10*3/uL (ref 0.0–0.1)
Basophils Relative: 0.6 % (ref 0.0–3.0)
EOS ABS: 0.1 10*3/uL (ref 0.0–0.7)
Eosinophils Relative: 2.6 % (ref 0.0–5.0)
HCT: 45.4 % (ref 39.0–52.0)
Hemoglobin: 15.1 g/dL (ref 13.0–17.0)
Lymphocytes Relative: 35.2 % (ref 12.0–46.0)
Lymphs Abs: 2 10*3/uL (ref 0.7–4.0)
MCHC: 33.3 g/dL (ref 30.0–36.0)
MCV: 87.9 fl (ref 78.0–100.0)
MONO ABS: 0.5 10*3/uL (ref 0.1–1.0)
Monocytes Relative: 9.2 % (ref 3.0–12.0)
Neutro Abs: 2.9 10*3/uL (ref 1.4–7.7)
Neutrophils Relative %: 52.4 % (ref 43.0–77.0)
Platelets: 288 10*3/uL (ref 150.0–400.0)
RBC: 5.16 Mil/uL (ref 4.22–5.81)
RDW: 12.8 % (ref 11.5–15.5)
WBC: 5.6 10*3/uL (ref 4.0–10.5)

## 2015-05-01 LAB — URINALYSIS, ROUTINE W REFLEX MICROSCOPIC
BILIRUBIN URINE: NEGATIVE
Hgb urine dipstick: NEGATIVE
KETONES UR: NEGATIVE
LEUKOCYTES UA: NEGATIVE
Mucus, UA: NONE SEEN — AB
Nitrite: NEGATIVE
PH: 5.5 (ref 5.0–8.0)
RBC / HPF: NONE SEEN (ref 0–?)
SPECIFIC GRAVITY, URINE: 1.015 (ref 1.000–1.030)
TOTAL PROTEIN, URINE-UPE24: NEGATIVE
UROBILINOGEN UA: 0.2 (ref 0.0–1.0)
Urine Glucose: NEGATIVE
WBC, UA: NONE SEEN (ref 0–?)

## 2015-05-01 LAB — TSH: TSH: 0.93 u[IU]/mL (ref 0.35–4.50)

## 2015-05-01 LAB — HEPATIC FUNCTION PANEL
ALT: 21 U/L (ref 0–53)
AST: 17 U/L (ref 0–37)
Albumin: 4.1 g/dL (ref 3.5–5.2)
Alkaline Phosphatase: 57 U/L (ref 39–117)
Bilirubin, Direct: 0.2 mg/dL (ref 0.0–0.3)
Total Bilirubin: 0.7 mg/dL (ref 0.2–1.2)
Total Protein: 7.1 g/dL (ref 6.0–8.3)

## 2015-05-01 LAB — LIPID PANEL
CHOL/HDL RATIO: 3
CHOLESTEROL: 126 mg/dL (ref 0–200)
HDL: 44.5 mg/dL (ref >39.00)
LDL CALC: 65 mg/dL (ref 0–99)
NonHDL: 81.69
TRIGLYCERIDES: 84 mg/dL (ref 0.0–149.0)
VLDL: 16.8 mg/dL (ref 0.0–40.0)

## 2015-05-01 LAB — BASIC METABOLIC PANEL
BUN: 15 mg/dL (ref 6–23)
CHLORIDE: 106 meq/L (ref 96–112)
CO2: 26 mEq/L (ref 19–32)
CREATININE: 1.08 mg/dL (ref 0.40–1.50)
Calcium: 9.5 mg/dL (ref 8.4–10.5)
GFR: 95.08 mL/min (ref >60.00)
Glucose, Bld: 102 mg/dL — ABNORMAL HIGH (ref 70–99)
Potassium: 4.2 mEq/L (ref 3.5–5.1)
Sodium: 139 mEq/L (ref 135–145)

## 2015-05-01 LAB — MICROALBUMIN / CREATININE URINE RATIO
CREATININE, U: 134.2 mg/dL
MICROALB UR: 2.8 mg/dL — AB (ref 0.0–1.9)
Microalb Creat Ratio: 2.1 mg/g (ref 0.0–30.0)

## 2015-05-01 LAB — PSA: PSA: 1.04 ng/mL (ref 0.10–4.00)

## 2015-05-01 LAB — HEMOGLOBIN A1C: Hgb A1c MFr Bld: 4.4 % — ABNORMAL LOW (ref 4.6–6.5)

## 2015-05-02 ENCOUNTER — Ambulatory Visit (INDEPENDENT_AMBULATORY_CARE_PROVIDER_SITE_OTHER): Payer: BLUE CROSS/BLUE SHIELD | Admitting: Internal Medicine

## 2015-05-02 ENCOUNTER — Encounter: Payer: Self-pay | Admitting: Internal Medicine

## 2015-05-02 VITALS — BP 130/90 | HR 72 | Wt 259.0 lb

## 2015-05-02 DIAGNOSIS — Z0000 Encounter for general adult medical examination without abnormal findings: Secondary | ICD-10-CM | POA: Diagnosis not present

## 2015-05-02 DIAGNOSIS — E119 Type 2 diabetes mellitus without complications: Secondary | ICD-10-CM | POA: Diagnosis not present

## 2015-05-02 DIAGNOSIS — I10 Essential (primary) hypertension: Secondary | ICD-10-CM

## 2015-05-02 NOTE — Progress Notes (Signed)
Pre visit review using our clinic review tool, if applicable. No additional management support is needed unless otherwise documented below in the visit note. 

## 2015-05-02 NOTE — Assessment & Plan Note (Signed)

## 2015-05-02 NOTE — Assessment & Plan Note (Signed)
Marked improvement with wt loss, ok for d/c metformin Lab Results  Component Value Date   HGBA1C 4.4* 05/01/2015

## 2015-05-02 NOTE — Progress Notes (Signed)
Subjective:    Patient ID: Jerome [name], male    DOB: May 22, 1971, 44 y.o.   MRN: [id number]  HPI  Here for wellness and f/u;  Overall doing ok;  Pt denies Chest pain, worsening SOB, DOE, wheezing, orthopnea, PND, worsening LE edema, palpitations, dizziness or syncope.  Pt denies neurological change such as new headache, facial or extremity weakness.  Pt denies polydipsia, polyuria, or low sugar symptoms. Pt states overall good compliance with treatment and medications, good tolerability, and has been trying to follow appropriate diet.  Pt denies worsening depressive symptoms, suicidal ideation or panic. No fever, night sweats, wt loss, loss of appetite, or other constitutional symptoms.  Pt states good ability with ADL's, has low fall risk, home safety reviewed and adequate, no other significant changes in hearing or vision, and only occasionally active with exercise. BP Readings from Last 3 Encounters:  05/02/15 130/90  04/26/14 122/70  10/26/13 120/70  Declines immun for now.  Wt Readings from Last 3 Encounters:  05/02/15 259 lb (117.482 kg)  10/25/14 281 lb (127.461 kg)  04/26/14 286 lb 8 oz (129.956 kg)   Lost over 25 lbs with more exercise and diet. Past Medical History  Diagnosis Date  . Impaired glucose tolerance 03/08/2011  . ALLERGIC RHINITIS 04/07/2007  . ASTHMA 04/07/2007  . CONSTIPATION 10/29/2009  . GERD 04/07/2007  . HEMATOCHEZIA 10/29/2009  . HYPERTENSION 04/01/2007  . LOW BACK PAIN 04/07/2007  . Morbid obesity 04/07/2007   No past surgical history on file.  reports that he has been smoking.  He does not have any smokeless tobacco history on file. He reports that he drinks alcohol. He reports that he does not use illicit drugs. family history includes Diabetes in his other; Prostate cancer in his other; Stroke in his other; Throat cancer in his other. No Known Allergies Current Outpatient Prescriptions on File Prior to Visit  Medication Sig Dispense Refill  . albuterol  (PROVENTIL HFA;VENTOLIN HFA) 108 (90 BASE) MCG/ACT inhaler Inhale 2 puffs into the lungs every 6 (six) hours as needed for wheezing. 1 Inhaler 11  . aspirin 81 MG tablet Take 81 mg by mouth daily.      Marland Kitchen glucose blood test strip Use as instructed 100 each 12  . glucose monitoring kit (FREESTYLE) monitoring kit 1 each by Does not apply route as needed for other. 1 each 0  . Lancets MISC Use as directed 1 per day for cbg check 100 each 12  . lisinopril (PRINIVIL,ZESTRIL) 5 MG tablet TAKE 1 TABLET BY MOUTH DAILY 90 tablet 3   No current facility-administered medications on file prior to visit.    Review of Systems Constitutional: Negative for increased diaphoresis, other activity, appetite or siginficant weight change other than noted HENT: Negative for worsening hearing loss, ear pain, facial swelling, mouth sores and neck stiffness.   Eyes: Negative for other worsening pain, redness or visual disturbance.  Respiratory: Negative for shortness of breath and wheezing  Cardiovascular: Negative for chest pain and palpitations.  Gastrointestinal: Negative for diarrhea, blood in stool, abdominal distention or other pain Genitourinary: Negative for hematuria, flank pain or change in urine volume.  Musculoskeletal: Negative for myalgias or other joint complaints.  Skin: Negative for color change and wound or drainage.  Neurological: Negative for syncope and numbness. other than noted Hematological: Negative for adenopathy. or other swelling Psychiatric/Behavioral: Negative for hallucinations, SI, self-injury, decreased concentration or other worsening agitation.      Objective:   Physical Exam BP 130/90  mmHg  Pulse 72  Wt 259 lb (117.482 kg)  SpO2 98% VS noted,  Constitutional: Pt is oriented to person, place, and time. Appears well-developed and well-nourished, in no significant distress Head: Normocephalic and atraumatic.  Right Ear: External ear normal.  Left Ear: External ear normal.    Nose: Nose normal.  Mouth/Throat: Oropharynx is clear and moist.  Eyes: Conjunctivae and EOM are normal. Pupils are equal, round, and reactive to light.  Neck: Normal range of motion. Neck supple. No JVD present. No tracheal deviation present or significant neck LA or mass Cardiovascular: Normal rate, regular rhythm, normal heart sounds and intact distal pulses.   Pulmonary/Chest: Effort normal and breath sounds without rales or wheezing  Abdominal: Soft. Bowel sounds are normal. NT. No HSM  Musculoskeletal: Normal range of motion. Exhibits no edema.  Lymphadenopathy:  Has no cervical adenopathy.  Neurological: Pt is alert and oriented to person, place, and time. Pt has normal reflexes. No cranial nerve deficit. Motor grossly intact Skin: Skin is warm and dry. No rash noted.  Psychiatric:  Has normal mood and affect. Behavior is normal.     Assessment & Plan:

## 2015-05-02 NOTE — Assessment & Plan Note (Signed)
stable overall by history and exam, recent data reviewed with pt, and pt to continue medical treatment as before,  to f/u any worsening symptoms or concerns, to cont ace for renoprotective BP Readings from Last 3 Encounters:  05/02/15 130/90  04/26/14 122/70  10/26/13 120/70

## 2015-05-02 NOTE — Patient Instructions (Signed)
Please continue all other medications as before, and refills have been done if requested.  Please have the pharmacy call with any other refills you may need.  Please continue your efforts at being more active, low cholesterol diet, and weight control.  You are otherwise up to date with prevention measures today.  Please keep your appointments with your specialists as you may have planned  Please return in 6 months, or sooner if needed, with Lab testing done 3-5 days before  

## 2015-10-25 ENCOUNTER — Other Ambulatory Visit: Payer: Self-pay | Admitting: Internal Medicine

## 2015-10-29 ENCOUNTER — Other Ambulatory Visit (INDEPENDENT_AMBULATORY_CARE_PROVIDER_SITE_OTHER): Payer: BLUE CROSS/BLUE SHIELD

## 2015-10-29 DIAGNOSIS — E119 Type 2 diabetes mellitus without complications: Secondary | ICD-10-CM | POA: Diagnosis not present

## 2015-10-29 LAB — HEMOGLOBIN A1C: HEMOGLOBIN A1C: 4.8 % (ref 4.6–6.5)

## 2015-10-29 LAB — LIPID PANEL
CHOL/HDL RATIO: 3
Cholesterol: 139 mg/dL (ref 0–200)
HDL: 44.3 mg/dL (ref >39.00)
LDL Cholesterol: 77 mg/dL (ref 0–99)
NONHDL: 94.91
Triglycerides: 88 mg/dL (ref 0.0–149.0)
VLDL: 17.6 mg/dL (ref 0.0–40.0)

## 2015-10-29 LAB — HEPATIC FUNCTION PANEL
ALK PHOS: 53 U/L (ref 39–117)
ALT: 19 U/L (ref 0–53)
AST: 20 U/L (ref 0–37)
Albumin: 4.1 g/dL (ref 3.5–5.2)
BILIRUBIN DIRECT: 0.1 mg/dL (ref 0.0–0.3)
TOTAL PROTEIN: 7.3 g/dL (ref 6.0–8.3)
Total Bilirubin: 0.6 mg/dL (ref 0.2–1.2)

## 2015-10-29 LAB — BASIC METABOLIC PANEL
BUN: 12 mg/dL (ref 6–23)
CALCIUM: 9.5 mg/dL (ref 8.4–10.5)
CHLORIDE: 105 meq/L (ref 96–112)
CO2: 27 meq/L (ref 19–32)
Creatinine, Ser: 1.11 mg/dL (ref 0.40–1.50)
GFR: 91.92 mL/min (ref >60.00)
Glucose, Bld: 113 mg/dL — ABNORMAL HIGH (ref 70–99)
Potassium: 4.3 mEq/L (ref 3.5–5.1)
SODIUM: 140 meq/L (ref 135–145)

## 2015-10-31 ENCOUNTER — Encounter: Payer: Self-pay | Admitting: Internal Medicine

## 2015-10-31 ENCOUNTER — Ambulatory Visit (INDEPENDENT_AMBULATORY_CARE_PROVIDER_SITE_OTHER): Payer: BLUE CROSS/BLUE SHIELD | Admitting: Internal Medicine

## 2015-10-31 VITALS — BP 134/82 | HR 65 | Temp 98.3°F | Resp 20 | Wt 258.0 lb

## 2015-10-31 DIAGNOSIS — K219 Gastro-esophageal reflux disease without esophagitis: Secondary | ICD-10-CM | POA: Diagnosis not present

## 2015-10-31 DIAGNOSIS — E119 Type 2 diabetes mellitus without complications: Secondary | ICD-10-CM

## 2015-10-31 DIAGNOSIS — J4520 Mild intermittent asthma, uncomplicated: Secondary | ICD-10-CM | POA: Diagnosis not present

## 2015-10-31 DIAGNOSIS — Z0000 Encounter for general adult medical examination without abnormal findings: Secondary | ICD-10-CM

## 2015-10-31 DIAGNOSIS — Z0189 Encounter for other specified special examinations: Secondary | ICD-10-CM

## 2015-10-31 DIAGNOSIS — I10 Essential (primary) hypertension: Secondary | ICD-10-CM

## 2015-10-31 NOTE — Patient Instructions (Signed)

## 2015-10-31 NOTE — Assessment & Plan Note (Signed)
Mild, diet controlled, o/w stable overall by history and exam, recent data reviewed with pt, and pt to continue medical treatment as before,  to f/u any worsening symptoms or concerns Lab Results  Component Value Date   HGBA1C 4.8 10/29/2015

## 2015-10-31 NOTE — Assessment & Plan Note (Signed)
stable overall by history and exam, recent data reviewed with pt, and pt to continue medical treatment as before,  to f/u any worsening symptoms or concerns Lab Results  Component Value Date   WBC 5.6 05/01/2015   HGB 15.1 05/01/2015   HCT 45.4 05/01/2015   PLT 288.0 05/01/2015   GLUCOSE 113* 10/29/2015   CHOL 139 10/29/2015   TRIG 88.0 10/29/2015   HDL 44.30 10/29/2015   LDLCALC 77 10/29/2015   ALT 19 10/29/2015   AST 20 10/29/2015   NA 140 10/29/2015   K 4.3 10/29/2015   CL 105 10/29/2015   CREATININE 1.11 10/29/2015   BUN 12 10/29/2015   CO2 27 10/29/2015   TSH 0.93 05/01/2015   PSA 1.04 05/01/2015   HGBA1C 4.8 10/29/2015   MICROALBUR 2.8* 05/01/2015

## 2015-10-31 NOTE — Progress Notes (Signed)
Pre visit review using our clinic review tool, if applicable. No additional management support is needed unless otherwise documented below in the visit note. 

## 2015-10-31 NOTE — Assessment & Plan Note (Signed)
stable overall by history and exam, recent data reviewed with pt, and pt to continue medical treatment as before,  to f/u any worsening symptoms or concerns BP Readings from Last 3 Encounters:  10/31/15 134/82  05/02/15 130/90  04/26/14 122/70

## 2015-10-31 NOTE — Assessment & Plan Note (Signed)
stable overall by history and exam, recent data reviewed with pt, and pt to continue medical treatment as before,  to f/u any worsening symptoms or concerns SpO2 Readings from Last 3 Encounters:  10/31/15 99%  05/02/15 98%  10/25/14 93%

## 2015-10-31 NOTE — Progress Notes (Signed)
Subjective:    Patient ID: Jerome [name], male    DOB: 19-Jul-1971, 45 y.o.   MRN: [id number]  HPI  Here to f/u; overall doing ok,  Pt denies chest pain, increasing sob or doe, wheezing, orthopnea, PND, increased LE swelling, palpitations, dizziness or syncope.  Pt denies new neurological symptoms such as new headache, or facial or extremity weakness or numbness.  Pt denies polydipsia, polyuria, or low sugar episode.   Pt denies new neurological symptoms such as new headache, or facial or extremity weakness or numbness.   Pt states overall good compliance with meds,clostely trying to follow appropriate diet, with wt down much with much improved exercise in the past yr with walking and lifting wts. Denies worsening reflux, abd pain, dysphagia, n/v, bowel change or blood - with reflux likely better with wt loss as well Wt Readings from Last 3 Encounters:  10/31/15 258 lb (117.028 kg)  05/02/15 259 lb (117.482 kg)  10/25/14 281 lb (127.461 kg)   Past Medical History  Diagnosis Date  . Impaired glucose tolerance 03/08/2011  . ALLERGIC RHINITIS 04/07/2007  . ASTHMA 04/07/2007  . CONSTIPATION 10/29/2009  . GERD 04/07/2007  . HEMATOCHEZIA 10/29/2009  . HYPERTENSION 04/01/2007  . LOW BACK PAIN 04/07/2007  . Morbid obesity ([place]) 04/07/2007   No past surgical history on file.  reports that he has been smoking.  He does not have any smokeless tobacco history on file. He reports that he drinks alcohol. He reports that he does not use illicit drugs. family history includes Diabetes in his other; Prostate cancer in his other; Stroke in his other; Throat cancer in his other. No Known Allergies Current Outpatient Prescriptions on File Prior to Visit  Medication Sig Dispense Refill  . albuterol (PROVENTIL HFA;VENTOLIN HFA) 108 (90 BASE) MCG/ACT inhaler Inhale 2 puffs into the lungs every 6 (six) hours as needed for wheezing. 1 Inhaler 11  . aspirin 81 MG tablet Take 81 mg by mouth daily.      Marland Kitchen glucose blood  test strip Use as instructed 100 each 12  . glucose monitoring kit (FREESTYLE) monitoring kit 1 each by Does not apply route as needed for other. 1 each 0  . Lancets MISC Use as directed 1 per day for cbg check 100 each 12  . lisinopril (PRINIVIL,ZESTRIL) 5 MG tablet TAKE 1 TABLET BY MOUTH EVERY DAY 90 tablet 0   No current facility-administered medications on file prior to visit.     Review of Systems  Constitutional: Negative for unusual diaphoresis or night sweats HENT: Negative for ringing in ear or discharge Eyes: Negative for double vision or worsening visual disturbance.  Respiratory: Negative for choking and stridor.   Gastrointestinal: Negative for vomiting or other signifcant bowel change Genitourinary: Negative for hematuria or change in urine volume.  Musculoskeletal: Negative for other MSK pain or swelling Skin: Negative for color change and worsening wound.  Neurological: Negative for tremors and numbness other than noted  Psychiatric/Behavioral: Negative for decreased concentration or agitation other than above       Objective:   Physical Exam BP 134/82 mmHg  Pulse 65  Temp(Src) 98.3 F (36.8 C) (Oral)  Resp 20  Wt 258 lb (117.028 kg)  SpO2 99% VS noted,  Constitutional: Pt appears in no significant distress HENT: Head: NCAT.  Right Ear: External ear normal.  Left Ear: External ear normal.  Eyes: . Pupils are equal, round, and reactive to light. Conjunctivae and EOM are normal Neck: Normal range of  motion. Neck supple.  Cardiovascular: Normal rate and regular rhythm.   Pulmonary/Chest: Effort normal and breath sounds without rales or wheezing.  Abd:  Soft, NT, ND, + BS Neurological: Pt is alert. Not confused , motor grossly intact Skin: Skin is warm. No rash, no LE edema Psychiatric: Pt behavior is normal. No agitation.     Assessment & Plan:

## 2016-04-29 ENCOUNTER — Other Ambulatory Visit: Payer: Self-pay | Admitting: Internal Medicine

## 2016-05-05 ENCOUNTER — Ambulatory Visit: Payer: BLUE CROSS/BLUE SHIELD | Admitting: Internal Medicine

## 2016-07-21 ENCOUNTER — Encounter: Payer: Self-pay | Admitting: Family Medicine

## 2016-07-21 ENCOUNTER — Observation Stay (HOSPITAL_COMMUNITY)
Admission: EM | Admit: 2016-07-21 | Discharge: 2016-07-21 | Disposition: A | Discharge status: Home / Self Care | Payer: BLUE CROSS/BLUE SHIELD | Attending: Family Medicine | Admitting: Family Medicine

## 2016-07-21 ENCOUNTER — Observation Stay (HOSPITAL_COMMUNITY): Payer: BLUE CROSS/BLUE SHIELD

## 2016-07-21 ENCOUNTER — Encounter (HOSPITAL_COMMUNITY): Payer: Self-pay | Admitting: *Deleted

## 2016-07-21 ENCOUNTER — Emergency Department (HOSPITAL_COMMUNITY): Payer: BLUE CROSS/BLUE SHIELD

## 2016-07-21 DIAGNOSIS — R002 Palpitations: Secondary | ICD-10-CM | POA: Diagnosis not present

## 2016-07-21 DIAGNOSIS — R079 Chest pain, unspecified: Secondary | ICD-10-CM | POA: Diagnosis not present

## 2016-07-21 DIAGNOSIS — I259 Chronic ischemic heart disease, unspecified: Principal | ICD-10-CM | POA: Insufficient documentation

## 2016-07-21 DIAGNOSIS — R0609 Other forms of dyspnea: Secondary | ICD-10-CM

## 2016-07-21 DIAGNOSIS — I10 Essential (primary) hypertension: Secondary | ICD-10-CM | POA: Diagnosis present

## 2016-07-21 DIAGNOSIS — Z7982 Long term (current) use of aspirin: Secondary | ICD-10-CM | POA: Diagnosis not present

## 2016-07-21 DIAGNOSIS — R072 Precordial pain: Secondary | ICD-10-CM | POA: Diagnosis not present

## 2016-07-21 DIAGNOSIS — R0789 Other chest pain: Secondary | ICD-10-CM | POA: Diagnosis not present

## 2016-07-21 DIAGNOSIS — Z79899 Other long term (current) drug therapy: Secondary | ICD-10-CM | POA: Diagnosis not present

## 2016-07-21 DIAGNOSIS — J45909 Unspecified asthma, uncomplicated: Secondary | ICD-10-CM | POA: Diagnosis not present

## 2016-07-21 DIAGNOSIS — F17200 Nicotine dependence, unspecified, uncomplicated: Secondary | ICD-10-CM | POA: Diagnosis not present

## 2016-07-21 DIAGNOSIS — E119 Type 2 diabetes mellitus without complications: Secondary | ICD-10-CM | POA: Diagnosis not present

## 2016-07-21 HISTORY — DX: Type 2 diabetes mellitus without complications: E11.9

## 2016-07-21 LAB — BASIC METABOLIC PANEL
ANION GAP: 6 (ref 5–15)
BUN: 13 mg/dL (ref 6–20)
CHLORIDE: 108 mmol/L (ref 101–111)
CO2: 25 mmol/L (ref 22–32)
Calcium: 9.5 mg/dL (ref 8.9–10.3)
Creatinine, Ser: 1.16 mg/dL (ref 0.61–1.24)
GFR calc non Af Amer: >60 mL/min (ref >60)
Glucose, Bld: 145 mg/dL — ABNORMAL HIGH (ref 65–99)
POTASSIUM: 3.8 mmol/L (ref 3.5–5.1)
Sodium: 139 mmol/L (ref 135–145)

## 2016-07-21 LAB — TROPONIN I: Troponin I: <0.03 ng/mL (ref <0.03)

## 2016-07-21 LAB — CBC
HCT: 42.1 % (ref 39.0–52.0)
HEMATOCRIT: 40.9 % (ref 39.0–52.0)
HEMOGLOBIN: 14.4 g/dL (ref 13.0–17.0)
HEMOGLOBIN: 13.5 g/dL (ref 13.0–17.0)
MCH: 29.1 pg (ref 26.0–34.0)
MCH: 28.2 pg (ref 26.0–34.0)
MCHC: 34.2 g/dL (ref 30.0–36.0)
MCHC: 33 g/dL (ref 30.0–36.0)
MCV: 85.2 fL (ref 78.0–100.0)
MCV: 85.6 fL (ref 78.0–100.0)
Platelets: 276 10*3/uL (ref 150–400)
Platelets: 264 10*3/uL (ref 150–400)
RBC: 4.94 MIL/uL (ref 4.22–5.81)
RBC: 4.78 MIL/uL (ref 4.22–5.81)
RDW: 11.9 % (ref 11.5–15.5)
RDW: 12.2 % (ref 11.5–15.5)
WBC: 7.7 10*3/uL (ref 4.0–10.5)
WBC: 6.8 10*3/uL (ref 4.0–10.5)

## 2016-07-21 LAB — RAPID URINE DRUG SCREEN, HOSP PERFORMED
AMPHETAMINES: NOT DETECTED
Barbiturates: NOT DETECTED
Benzodiazepines: NOT DETECTED
COCAINE: NOT DETECTED
OPIATES: NOT DETECTED
Tetrahydrocannabinol: NOT DETECTED

## 2016-07-21 LAB — I-STAT TROPONIN, ED: TROPONIN I, POC: 0.01 ng/mL (ref 0.00–0.08)

## 2016-07-21 LAB — CREATININE, SERUM: Creatinine, Ser: 1.03 mg/dL (ref 0.61–1.24)

## 2016-07-21 MED ORDER — LISINOPRIL 5 MG PO TABS
5.0000 mg | ORAL_TABLET | Freq: Every day | ORAL | Status: DC
  Filled 2016-07-21: qty 1

## 2016-07-21 MED ORDER — NITROGLYCERIN 0.4 MG SL SUBL
0.4000 mg | SUBLINGUAL_TABLET | SUBLINGUAL | Status: DC | PRN
  Administered 2016-07-21: 0.8 mg via SUBLINGUAL

## 2016-07-21 MED ORDER — ASPIRIN EC 325 MG PO TBEC
325.0000 mg | DELAYED_RELEASE_TABLET | Freq: Every day | ORAL | Status: DC
  Administered 2016-07-21: 325 mg via ORAL
  Filled 2016-07-21: qty 1

## 2016-07-21 MED ORDER — IOPAMIDOL (ISOVUE-370) INJECTION 76%
INTRAVENOUS | Status: AC
  Administered 2016-07-21: 80 mL
  Filled 2016-07-21: qty 100

## 2016-07-21 MED ORDER — ONDANSETRON HCL 4 MG/2ML IJ SOLN: 4.0000 mg | Freq: Four times a day (QID) | INTRAMUSCULAR | Status: DC | PRN

## 2016-07-21 MED ORDER — NITROGLYCERIN 0.4 MG SL SUBL
SUBLINGUAL_TABLET | SUBLINGUAL | Status: AC
  Administered 2016-07-21: 0.8 mg via SUBLINGUAL
  Filled 2016-07-21: qty 2

## 2016-07-21 MED ORDER — ACETAMINOPHEN 325 MG PO TABS: 650.0000 mg | ORAL_TABLET | ORAL | Status: DC | PRN

## 2016-07-21 MED ORDER — ENOXAPARIN SODIUM 40 MG/0.4ML ~~LOC~~ SOLN: 40.0000 mg | SUBCUTANEOUS | Status: DC

## 2016-07-21 NOTE — H&P (Signed)
History and Physical    Navjot Charlson JKK:[id number] DOB: 02-25-1971 DOA: 07/21/2016  PCP: [name], MD  Patient coming from: Home.  Chief Complaint: Chest pain.  HPI: Jerome [name] is a 45 y.o. male with history of hypertension, tobacco abuse and previous history of diabetes mellitus type 2 presents to the ER because of chest pain. Patient started experiencing chest pain last night around 9 PM while sleeping. Pain is retrosternal sharp shooting with no associated nausea vomiting diaphoresis or shortness of breath. Pain lasted for around 10 minutes and had resolved by the time patient reached ER. EKG chest x-ray and cardiac markers were negative. Patient is being admitted for further workup for chest pain.   ED Course: EKG cardiac markers and chest x-ray were unremarkable.  Review of Systems: As per HPI, rest all negative.   Past Medical History:  Diagnosis Date  . ALLERGIC RHINITIS 04/07/2007  . ASTHMA 04/07/2007  . CONSTIPATION 10/29/2009  . GERD 04/07/2007  . HEMATOCHEZIA 10/29/2009  . HYPERTENSION 04/01/2007  . Impaired glucose tolerance 03/08/2011  . LOW BACK PAIN 04/07/2007  . Morbid obesity ([place]) 04/07/2007    History reviewed. No pertinent surgical history.   reports that he has been smoking.  He has never used smokeless tobacco. He reports that he drinks alcohol. He reports that he does not use drugs.  No Known Allergies  Family History  Problem Relation Age of Onset  . Diabetes Other   . Throat cancer Other   . Stroke Other   . Prostate cancer Other   . Diabetes Mellitus II Father   . CAD Neg Hx     Prior to Admission medications   Medication Sig Start Date End Date Taking? Authorizing Provider  lisinopril (PRINIVIL,ZESTRIL) 5 MG tablet TAKE 1 TABLET BY MOUTH EVERY DAY 04/29/16  Yes Biagio Borg, MD  albuterol (PROVENTIL HFA;VENTOLIN HFA) 108 (90 BASE) MCG/ACT inhaler Inhale 2 puffs into the lungs every 6 (six) hours as needed for wheezing. Patient not taking:  Reported on 07/21/2016 04/26/13   Biagio Borg, MD  glucose blood test strip Use as instructed 01/25/13   Biagio Borg, MD  glucose monitoring kit (FREESTYLE) monitoring kit 1 each by Does not apply route as needed for other. 01/25/13   Biagio Borg, MD  Lancets MISC Use as directed 1 per day for cbg check 01/25/13   Biagio Borg, MD    Physical Exam: Vitals:   07/21/16 0230 07/21/16 0303 07/21/16 0330 07/21/16 0430  BP: 131/71 125/75 115/65 116/66  Pulse: 69 69 69 64  Resp: _0 Temp:      TempSrc:      SpO2: 99% 98% 98% 100%      Constitutional: Moderately built and nourished. Vitals:   07/21/16 0230 07/21/16 0303 07/21/16 0330 07/21/16 0430  BP: 131/71 125/75 115/65 116/66  Pulse: 69 69 69 64  Resp: _1 Temp:      TempSrc:      SpO2: 99% 98% 98% 100%   Eyes: Anicteric pallor. ENMT: No discharge from the ears eyes nose or mouth. Neck: No mass felt. No neck rigidity. No JVD appreciated. Respiratory: No rhonchi or crepitations. Cardiovascular: S1 is heard. No murmurs Abdomen:  Musculoskeletal: No edema. Skin: No rash skin appears warm. Neurologic: Alert awake oriented to time place and person. Moves all extremities. Psychiatric: Appears normal. Normal exam.   Labs on Admission: I have personally reviewed following labs and imaging  studies  CBC:  Recent Labs Lab 07/21/16 0105  WBC 7.7  HGB 14.4  HCT 42.1  MCV 85.2  PLT 563   Basic Metabolic Panel:  Recent Labs Lab 07/21/16 0105  NA 139  K 3.8  CL 108  CO2 25  GLUCOSE 145*  BUN 13  CREATININE 1.16  CALCIUM 9.5   GFR: CrCl cannot be calculated (Unknown ideal weight.). Liver Function Tests: No results for input(s): AST, ALT, ALKPHOS, BILITOT, PROT, ALBUMIN in the last 168 hours. No results for input(s): LIPASE, AMYLASE in the last 168 hours. No results for input(s): AMMONIA in the last 168 hours. Coagulation Profile: No results for input(s): INR, PROTIME in the last 168  hours. Cardiac Enzymes: No results for input(s): CKTOTAL, CKMB, CKMBINDEX, TROPONINI in the last 168 hours. BNP (last 3 results) No results for input(s): PROBNP in the last 8760 hours. HbA1C: No results for input(s): HGBA1C in the last 72 hours. CBG: No results for input(s): GLUCAP in the last 168 hours. Lipid Profile: No results for input(s): CHOL, HDL, LDLCALC, TRIG, CHOLHDL, LDLDIRECT in the last 72 hours. Thyroid Function Tests: No results for input(s): TSH, T4TOTAL, FREET4, T3FREE, THYROIDAB in the last 72 hours. Anemia Panel: No results for input(s): VITAMINB12, FOLATE, FERRITIN, TIBC, IRON, RETICCTPCT in the last 72 hours. Urine analysis:    Component Value Date/Time   COLORURINE YELLOW 05/01/2015 0843   APPEARANCEUR CLEAR 05/01/2015 0843   LABSPEC 1.015 05/01/2015 0843   PHURINE 5.5 05/01/2015 0843   GLUCOSEU NEGATIVE 05/01/2015 0843   HGBUR NEGATIVE 05/01/2015 0843   BILIRUBINUR NEGATIVE 05/01/2015 0843   KETONESUR NEGATIVE 05/01/2015 0843   PROTEINUR NEGATIVE 03/08/2010 0659   UROBILINOGEN 0.2 05/01/2015 0843   NITRITE NEGATIVE 05/01/2015 0843   LEUKOCYTESUR NEGATIVE 05/01/2015 0843   Sepsis Labs: _0 (procalcitonin:4,lacticidven:4) )No results found for this or any previous visit (from the past 240 hour(s)).   Radiological Exams on Admission: Dg Chest 2 View  Result Date: 07/21/2016 CLINICAL DATA:  Chest pain radiating into the left upper extremity for 1 hour EXAM: CHEST  2 VIEW COMPARISON:  10/24/2012 FINDINGS: The heart size and mediastinal contours are within normal limits. Both lungs are clear. The visualized skeletal structures are unremarkable. IMPRESSION: No active cardiopulmonary disease. Electronically Signed   By: [name] M.D.   On: 07/21/2016 01:56    EKG: Independently reviewed. Normal sinus rhythm.  Assessment/Plan Principal Problem:   Chest pain Active Problems:   Essential hypertension    1. Chest pain - appears atypical  however patient has risk factors including hypertension, obesity, family history tobacco abuse and previous history of diabetes mellitus. I have consulted cardiology. Cycle cardiac markers check 2-D echo. Aspirin. Nothing by mouth in anticipation of cardiac procedure. 2. Hypertension on lisinopril. 3. Tobacco abuse - tobacco cessation counseling requested. 4. History of diabetes presently off medication since patient lost weight.   DVT prophylaxis: Lovenox. Code Status: Full code.  Family Communication: Patient's wife.  Disposition Plan: Home.  Consults called: Cardiology.  Admission status: Observation.    Rise Patience MD [facility] Pager [phone].  If 7PM-7AM, please contact night-coverage [email or web address] Password [facility]  07/21/2016, 5:38 AM

## 2016-07-21 NOTE — Progress Notes (Signed)
  Patient's Cardiac CT showed:  IMPRESSION: 1. Coronary calcium score of 0. This was 0 percentile for age and sex matched control.  2. Normal coronary origin with left dominance.  3. No evidence of CAD.  Discussed with Dr. Meda Coffee. Good for discharge from cardiac perspective. Will make admitting team aware.   Signed, Erma Heritage, PA-C 07/21/2016, 5:29 PM Pager: [phone]

## 2016-07-21 NOTE — Progress Notes (Signed)
Patient seen and examined Awoke from sleep with chest pain and discomfort as well as swelling First and this is a No family members with CAD Smokes only 1 cigarettes a day Not On metformin for the past year has lost 50 pounds  Troponin trend has been flat Patient ate breakfast this morning EKG repeat at around 11:30 this morning looked stable  Await cardiology input Potentially have workup as an outpatient?  Verneita Griffes, MD [facility] [phone]

## 2016-07-21 NOTE — Discharge Summary (Signed)
Physician Discharge Summary  Jerome [name] RWE:[id number] DOB: 1971-08-02 DOA: 07/21/2016  PCP: [name], MD  Admit date: 07/21/2016 Discharge date: 07/21/2016  Time spent: 20 minutes  Recommendations for Outpatient Follow-up:  1. Needs OP risk modification  Discharge Diagnoses:  Principal Problem:   Chest pain Active Problems:   Essential hypertension   Discharge Condition: good  Diet recommendation: dm  Filed Weights   07/21/16 1142  Weight: 122.5 kg (270 lb 1.6 oz)    History of present illness:   45 y/o ? Htn, dm admitted with CP Ruled out by EKG, Troponin  Cardiology saw, did Cor CT which turned out neg  d/c home OP follow up Dr. [name]    Discharge Exam: Vitals:   07/21/16 1142 07/21/16 1449  BP: 129/61 129/71  Pulse: (!) 52 (!) 50  Resp:    Temp: 97.7 F (36.5 C) 97.7 F (36.5 C)    General: eomi ncat Cardiovascular: s1 s 2no m/r/g Respiratory: clear  Discharge Instructions   Discharge Instructions    Diet - low sodium heart healthy    Complete by:  As directed    Discharge instructions    Complete by:  As directed    Follow-up with regular pcp   Increase activity slowly    Complete by:  As directed      Current Discharge Medication List    CONTINUE these medications which have NOT CHANGED   Details  lisinopril (PRINIVIL,ZESTRIL) 5 MG tablet TAKE 1 TABLET BY MOUTH EVERY DAY Qty: 90 tablet, Refills: 0    albuterol (PROVENTIL HFA;VENTOLIN HFA) 108 (90 BASE) MCG/ACT inhaler Inhale 2 puffs into the lungs every 6 (six) hours as needed for wheezing. Qty: 1 Inhaler, Refills: 11    glucose blood test strip Use as instructed Qty: 100 each, Refills: 12    glucose monitoring kit (FREESTYLE) monitoring kit 1 each by Does not apply route as needed for other. Qty: 1 each, Refills: 0    Lancets MISC Use as directed 1 per day for cbg check Qty: 100 each, Refills: 12       No Known Allergies    The results of significant diagnostics from  this hospitalization (including imaging, microbiology, ancillary and laboratory) are listed below for reference.    Significant Diagnostic Studies: Dg Chest 2 View  Result Date: 07/21/2016 CLINICAL DATA:  Chest pain radiating into the left upper extremity for 1 hour EXAM: CHEST  2 VIEW COMPARISON:  10/24/2012 FINDINGS: The heart size and mediastinal contours are within normal limits. Both lungs are clear. The visualized skeletal structures are unremarkable. IMPRESSION: No active cardiopulmonary disease. Electronically Signed   By: [name] M.D.   On: 07/21/2016 01:56   Ct Coronary Morph W/cta Cor W/score W/ca W/cm &/or Wo/cm  Addendum Date: 07/21/2016   ADDENDUM REPORT: 07/21/2016 17:24 CLINICAL DATA:  -year-old male with atypical chest pain and family h/o premature CAD. EXAM: Cardiac/Coronary  CT TECHNIQUE: The patient was scanned on a Philips 256 scanner. FINDINGS: A 120 kV prospective scan was triggered in the descending thoracic aorta at 111 HU's. Axial non-contrast 3 mm slices were carried out through the heart. The data set was analyzed on a dedicated work station and scored using the [place]. Gantry rotation speed was 270 msecs and collimation was .9 mm. No beta blockade and 0.8 mg of sl NTG was given. The 3D data set was reconstructed in 5% intervals of the 67-82 % of the R-R cycle. Diastolic phases were analyzed on  a dedicated work station using MPR, MIP and VRT modes. The patient received 80 cc of contrast. Study quality is affected by patient's size and motion artifact. Aorta:  Normal size.  No calcifications.  No dissection. Aortic Valve:  [place].  No calcifications. Coronary Arteries:  Normal coronary origin.  Left dominance. Left main is a large artery that gives rise to LAD and LCX arteries. LAD is a large vessel that gives rise to two diagonal branches, and wraps around the apex. There is no plaque. LCX is a large dominant artery that gives rise to one large OM1 branch,  PDA and a smal PLA. There is no plaque. Other findings: Normal pulmonary vein drainage into the left atrium. Normal let atrial appendage without a thrombus. IMPRESSION: 1. Coronary calcium score of 0. This was 0 percentile for age and sex matched control. 2. Normal coronary origin with left dominance. 3. No evidence of CAD. [name] Electronically Signed   By: [name]   On: 07/21/2016 17:24   Result Date: 07/21/2016 EXAM: OVER-READ INTERPRETATION  CT CHEST The following report is an over-read performed by radiologist Dr. Collene Leyden [facility] on 07/21/2016. This over-read does not include interpretation of cardiac or coronary anatomy or pathology. The coronary CTA interpretation by the cardiologist is attached. COMPARISON:  None. FINDINGS: Cardiovascular: Heart is normal size. Aorta is normal caliber. Mediastinum/Nodes: No adenopathy in the visualized lower mediastinum, hila or axillae Lungs/Pleura: Visualized lungs clear.  No effusions. Upper Abdomen: Imaging into the upper abdomen shows no acute findings. Musculoskeletal: No chest wall mass or suspicious bone lesions identified. IMPRESSION: No acute or significant extracardiac abnormality. Electronically Signed: By: [name] M.D. On: 07/21/2016 17:00    Microbiology: No results found for this or any previous visit (from the past 240 hour(s)).   Labs: Basic Metabolic Panel:  Recent Labs Lab 07/21/16 0105 07/21/16 0546  NA 139  --   K 3.8  --   CL 108  --   CO2 25  --   GLUCOSE 145*  --   BUN 13  --   CREATININE 1.16 1.03  CALCIUM 9.5  --    Liver Function Tests: No results for input(s): AST, ALT, ALKPHOS, BILITOT, PROT, ALBUMIN in the last 168 hours. No results for input(s): LIPASE, AMYLASE in the last 168 hours. No results for input(s): AMMONIA in the last 168 hours. CBC:  Recent Labs Lab 07/21/16 0105 07/21/16 0546  WBC 7.7 6.8  HGB 14.4 13.5  HCT 42.1 40.9  MCV 85.2 85.6  PLT 276 264    Cardiac Enzymes:  Recent Labs Lab 07/21/16 0546 07/21/16 1145  TROPONINI <0.03 <0.03   BNP: BNP (last 3 results) No results for input(s): BNP in the last 8760 hours.  ProBNP (last 3 results) No results for input(s): PROBNP in the last 8760 hours.  CBG: No results for input(s): GLUCAP in the last 168 hours.     SignedNita Sells MD   [facility] 07/21/2016, 5:36 PM

## 2016-07-21 NOTE — Consult Note (Signed)
CARDIOLOGY CONSULT NOTE   Patient ID: Jerome [name] MRN: [id number] DOB/AGE: May 20, 1971 45 y.o.  Admit date: 07/21/2016  Primary Physician   Jerome Cower, MD Primary Cardiologist   New Reason for Consultation   Chest pain Requesting MD: Dr Hal Hope  KCL:EXNTZG Jerome [name] is a 45 y.o. year old male with a history of morbid obesity, HTN, GERD, asthma, glucose intolerance.  Pt admitted 12/04 with chest pain, cards asked to see.   Pt had been running, had lost weight and been able to come off metformin. He did not run during July/August, felt burned out. When he went back to running, he was very SOB, pt felt like out of proportion to exertion. He started walking, not running, and has worked up to a light jog. His SOB has improved. He never had CP w/ exertion.  Since September 1-2 x week, pt will have palpitations. He feels his heart skip multiple times. The symptoms will last several minutes, but have lasted longer. When it is doing it, it keeps him awake. He will eventually be able to sleep. The sx resolve.  Pt started back to work October 2nd, had been out for over a year. There is some lifting involved with his job, he has had some back problems because of this. The back pain seems to radiate around to his chest.   Last pm, he was sleeping and was wakened by his wife to change position. He woke with chest pain. It felt like a sharp, cramping pain. 5-6/10. He had pain in his L shoulder that radiated to his triceps with a tingling in his L arm. His wife states he had been sleeping on his L side. He felt hot and got up, his heart rate was higher than normal. He was able to calm himself but still had the back/chest pain and the pain/tingling in his L arm. This concerned him so he came to the ER. He is still having back pain that seems to radiate to his chest, a [name] ache. HIs Left arm sx have resolved. Those were new. He got no rx in the ER.    Past Medical History:  Diagnosis Date  .  ALLERGIC RHINITIS 04/07/2007  . ASTHMA 04/07/2007  . CONSTIPATION 10/29/2009  . Diabetes mellitus type II, non insulin dependent ([place]) 03/08/2011   Improved w/ weight loss, A1c followed by primary care  . GERD 04/07/2007  . HEMATOCHEZIA 10/29/2009  . HYPERTENSION 04/01/2007  . LOW BACK PAIN 04/07/2007  . Morbid obesity ([place]) 04/07/2007     Past Surgical History:  Procedure Laterality Date  . CHOLECYSTECTOMY  2014   No Known Allergies  I have reviewed the patient's current medications . aspirin EC  325 mg Oral Daily  . enoxaparin (LOVENOX) injection  40 mg Subcutaneous Q24H  . lisinopril  5 mg Oral Daily    acetaminophen, nitroGLYCERIN, ondansetron (ZOFRAN) IV  Prior to Admission medications   Medication Sig Start Date End Date Taking? Authorizing Provider  lisinopril (PRINIVIL,ZESTRIL) 5 MG tablet TAKE 1 TABLET BY MOUTH EVERY DAY 04/29/16  Yes Biagio Borg, MD  albuterol (PROVENTIL HFA;VENTOLIN HFA) 108 (90 BASE) MCG/ACT inhaler Inhale 2 puffs into the lungs every 6 (six) hours as needed for wheezing. Patient not taking: Reported on 07/21/2016 04/26/13   Biagio Borg, MD  glucose blood test strip Use as instructed 01/25/13   Biagio Borg, MD  glucose monitoring kit (FREESTYLE) monitoring kit 1 each by Does not apply route as  needed for other. 01/25/13   Biagio Borg, MD  Lancets MISC Use as directed 1 per day for cbg check 01/25/13   Biagio Borg, MD     Social History   Social History  . Marital status: Married    Spouse name: N/A  . Number of children: N/A  . Years of education: N/A   Occupational History  . [employer], [employer] Services operational officer    Social History Main Topics  . Smoking status: Current Every Day Smoker    Packs/day: 0.10  . Smokeless tobacco: Never Used     Comment: Wife smokes more.  . Alcohol use Yes  . Drug use: No  . Sexual activity: Not on file   Other Topics Concern  . Not on file   Social History Narrative   Pt lives with wife and 3 kids.    Family Status   Relation Status  . Other   . Father Deceased  . Maternal Uncle   . Mother Alive  . Neg Hx    Family History  Problem Relation Age of Onset  . Stroke Other   . Diabetes Mellitus II Father   . Prostate cancer Father   . Throat cancer Maternal Uncle   . CAD Neg Hx      ROS:  Full 14 point review of systems complete and found to be negative unless listed above.  Physical Exam: Blood pressure 129/61, pulse (!) 52, temperature 97.7 F (36.5 C), temperature source Oral, resp. rate 16, height '5\' 8"'$  (1.727 m), weight 270 lb 1.6 oz (122.5 kg), SpO2 95 %.  General: Well developed, well nourished, male in no acute distress Head: Eyes PERRLA, No xanthomas.   Normocephalic and atraumatic, oropharynx without edema or exudate. Dentition: good Lungs: clear bilaterally Heart: HRRR S1 S2, no rub/gallop, no sig murmur. pulses are 2+ all 4 extrem.   Neck: No carotid bruits. No lymphadenopathy.  JVD not elevated Abdomen: Bowel sounds present, abdomen soft and non-tender without masses or hernias noted. Msk:  No spine or cva tenderness. No weakness, no joint deformities or effusions. Extremities: No clubbing or cyanosis. No edema.  Neuro: Alert and oriented X 3. No focal deficits noted. Psych:  Good affect, responds appropriately Skin: No rashes or lesions noted.  Labs:   Lab Results  Component Value Date   WBC 6.8 07/21/2016   HGB 13.5 07/21/2016   HCT 40.9 07/21/2016   MCV 85.6 07/21/2016   PLT 264 07/21/2016     Recent Labs Lab 07/21/16 0105 07/21/16 0546  NA 139  --   K 3.8  --   CL 108  --   CO2 25  --   BUN 13  --   CREATININE 1.16 1.03  CALCIUM 9.5  --   GLUCOSE 145*  --     Recent Labs  07/21/16 0546 07/21/16 1145  TROPONINI <0.03 <0.03    Recent Labs  07/21/16 0125  TROPIPOC 0.01   Lab Results  Component Value Date   CHOL 139 10/29/2015   HDL 44.30 10/29/2015   LDLCALC 77 10/29/2015   TRIG 88.0 10/29/2015   TSH  Date/Time Value Ref Range Status    05/01/2015 08:43 AM 0.93 0.35 - 4.50 uIU/mL Final    Echo: ordered  ECG:  12/05 Sinus brady, HR 59, no acute changes  Radiology:  Dg Chest 2 View  Result Date: 07/21/2016 CLINICAL DATA:  Chest pain radiating into the left upper extremity for 1 hour EXAM: CHEST  2  VIEW COMPARISON:  10/24/2012 FINDINGS: The heart size and mediastinal contours are within normal limits. Both lungs are clear. The visualized skeletal structures are unremarkable. IMPRESSION: No active cardiopulmonary disease. Electronically Signed   By: [name] M.D.   On: 07/21/2016 01:56   ASSESSMENT AND PLAN:   The patient was seen today by Dr Meda Coffee, the patient evaluated and the data reviewed.   Principal Problem:   Chest pain - ez neg MI and ECG not acute despite > 12 hr pain - Pt has multiple CRFs including obesity, DM, HTN, Tob use. Pt says lipids have been good in the past.  - pt would benefit from definitive answer>>cardiac CT, Dr Meda Coffee to read.  Otherwise, per IM Active Problems:   Essential hypertension  Signed: Lenoard Aden 07/21/2016 1:13 PM Beeper [phone]  Co-Sign MD  The patient was seen, examined and discussed with Rosaria Ferries, PA-C and I agree with the above.   45 year old male with no prior cardiac history admitted on 12/4 with atypical chest pain, troponin negative x 3, ECG shows SR.  The patient has h/o obesity, DM, used to tun, now walks regularly and was able to d/c metformin with weight loss. He has noticed DOE in the last 2 months, as well as palpitations that occur approximately 1-2 x/week lasting for several minutes, not associated with dizziness, presyncope or syncope.  He woke up with cramping back pain radiating to his chest associated with [name] pain and left arm tingling that brought him to the ER.  He is currently asymptomatic.   Pt started back to work October 2nd, had been out for over a year. There is some lifting involved with his job, he has had some back  problems because of this. The back pain seems to radiate around to his chest. His BP is well controlled on low dose lisinopril 5 mg po daily. The patient denies prior smoking, he has no FH of early CAD.  We will plan for a calcium score and coronary CTA to evaluate for CAD.    [name], MD 07/21/2016

## 2016-07-21 NOTE — ED Triage Notes (Signed)
Pt to ED c/o chest pain with L arm numbness that woke him from sleep. Pt reports pain radiated into back after first waking up and had nausea. Pt denies cardiac hx

## 2016-07-21 NOTE — Discharge Instructions (Signed)
Chest Wall Pain °Introduction °Chest wall pain is pain in or around the bones and muscles of your chest. Sometimes, an injury causes this pain. Sometimes, the cause may not be known. This pain may take several weeks or longer to get better. °Follow these instructions at home: °Pay attention to any changes in your symptoms. Take these actions to help with your pain: °· Rest as told by your doctor. °· Avoid activities that cause pain. Try not to use your chest, belly (abdominal), or side muscles to lift heavy things. °· If directed, apply ice to the painful area: °¨ Put ice in a plastic bag. °¨ Place a towel between your skin and the bag. °¨ Leave the ice on for 20 minutes, 2-3 times per day. °· Take over-the-counter and prescription medicines only as told by your doctor. °· Do not use tobacco products, including cigarettes, chewing tobacco, and e-cigarettes. If you need help quitting, ask your doctor. °· Keep all follow-up visits as told by your doctor. This is important. °Contact a doctor if: °· You have a fever. °· Your chest pain gets worse. °· You have new symptoms. °Get help right away if: °· You feel sick to your stomach (nauseous) or you throw up (vomit). °· You feel sweaty or light-headed. °· You have a cough with phlegm (sputum) or you cough up blood. °· You are short of breath. °This information is not intended to replace advice given to you by your health care provider. Make sure you discuss any questions you have with your health care provider. °Document Released: 01/20/2008 Document Revised: 01/09/2016 Document Reviewed: 10/29/2014 °© 2017 [employer] ° °

## 2016-07-21 NOTE — ED Provider Notes (Signed)
[facility] DEPT Provider Note   CSN: [id number] Arrival date & time: 07/21/16  8938   By signing my name below, I, Evelene Croon, attest that this documentation has been prepared under the direction and in the presence of Everlene Balls, MD . Electronically Signed: Evelene Croon, Scribe. 07/21/2016. 2:19 AM.  History   Chief Complaint Chief Complaint  Patient presents with  . Chest Pain   The history is provided by the patient. No language interpreter was used.   HPI Comments:  Jerome [name] is a 45 y.o. male with a history of HTN, who presents to the Emergency Department complaining of left sided CP that woke him from his sleep ~2345 last night. He describes his CP as sharp at onset. He notes an associated shooting pain into his back and an abnormal sensation to the posterior left arm. He states his arm felt like it was asleep. Pt reports h/o similar milder episodes x a few months which began after his first workout after a month hiatus. Pt denies recent long periods of immobilization. He also denies cough, rhinorrhea, diaphoresis, and SOB. Pt took ASA prior to arrival. Pt states his symptoms have resolved at this time.     Past Medical History:  Diagnosis Date  . ALLERGIC RHINITIS 04/07/2007  . ASTHMA 04/07/2007  . CONSTIPATION 10/29/2009  . GERD 04/07/2007  . HEMATOCHEZIA 10/29/2009  . HYPERTENSION 04/01/2007  . Impaired glucose tolerance 03/08/2011  . LOW BACK PAIN 04/07/2007  . Morbid obesity ([place]) 04/07/2007    Patient Active Problem List   Diagnosis Date Noted  . Cough 10/25/2014  . Increased prostate specific antigen (PSA) velocity 04/26/2014  . Loss of weight 10/24/2012  . Urinary frequency 10/24/2012  . Edema 03/17/2011  . Diabetes ([place]) 03/08/2011  . Preventative health care 03/08/2011  . CONSTIPATION 10/29/2009  . HEMATOCHEZIA 10/29/2009  . Morbid obesity ([place]) 04/07/2007  . ALLERGIC RHINITIS 04/07/2007  . Asthma 04/07/2007  . GERD 04/07/2007  . LOW BACK PAIN  04/07/2007  . Essential hypertension 04/01/2007    History reviewed. No pertinent surgical history.     Home Medications    Prior to Admission medications   Medication Sig Start Date End Date Taking? Authorizing Provider  albuterol (PROVENTIL HFA;VENTOLIN HFA) 108 (90 BASE) MCG/ACT inhaler Inhale 2 puffs into the lungs every 6 (six) hours as needed for wheezing. 04/26/13   Biagio Borg, MD  aspirin 81 MG tablet Take 81 mg by mouth daily.      Historical Provider, MD  glucose blood test strip Use as instructed 01/25/13   Biagio Borg, MD  glucose monitoring kit (FREESTYLE) monitoring kit 1 each by Does not apply route as needed for other. 01/25/13   Biagio Borg, MD  Lancets MISC Use as directed 1 per day for cbg check 01/25/13   Biagio Borg, MD  lisinopril (PRINIVIL,ZESTRIL) 5 MG tablet TAKE 1 TABLET BY MOUTH EVERY DAY 04/29/16   Biagio Borg, MD    Family History Family History  Problem Relation Age of Onset  . Diabetes Other   . Throat cancer Other   . Stroke Other   . Prostate cancer Other     Social History Social History  Substance Use Topics  . Smoking status: Current Every Day Smoker  . Smokeless tobacco: Never Used  . Alcohol use Yes     Allergies   Patient has no known allergies.   Review of Systems Review of Systems 10 systems reviewed and all are  negative for acute change except as noted in the HPI.  Physical Exam Updated Vital Signs BP 155/76   Pulse 89   Temp 98 F (36.7 C) (Oral)   Resp 18   SpO2 97%   Physical Exam  Constitutional: He is oriented to person, place, and time. Vital signs are normal. He appears well-developed and well-nourished.  Non-toxic appearance. He does not appear ill. No distress.  HENT:  Head: Normocephalic and atraumatic.  Nose: Nose normal.  Mouth/Throat: Oropharynx is clear and moist. No oropharyngeal exudate.  Eyes: Conjunctivae and EOM are normal. Pupils are equal, round, and reactive to light. No scleral icterus.    Neck: Normal range of motion. Neck supple. No tracheal deviation, no edema, no erythema and normal range of motion present. No thyroid mass and no thyromegaly present.  Cardiovascular: Normal rate, regular rhythm, S1 normal, S2 normal, normal heart sounds, intact distal pulses and normal pulses.  Exam reveals no gallop and no friction rub.   No murmur heard. Pulmonary/Chest: Effort normal and breath sounds normal. No respiratory distress. He has no wheezes. He has no rhonchi. He has no rales.  Abdominal: Soft. Normal appearance and bowel sounds are normal. He exhibits no distension, no ascites and no mass. There is no hepatosplenomegaly. There is no tenderness. There is no rebound, no guarding and no CVA tenderness.  Musculoskeletal: Normal range of motion. He exhibits no edema or tenderness.  Lymphadenopathy:    He has no cervical adenopathy.  Neurological: He is alert and oriented to person, place, and time. He has normal strength. No cranial nerve deficit or sensory deficit.  Skin: Skin is warm, dry and intact. No petechiae and no rash noted. He is not diaphoretic. No erythema. No pallor.  Nursing note and vitals reviewed.    ED Treatments / Results  DIAGNOSTIC STUDIES:  Oxygen Saturation is 97% on RA, normal by my interpretation.    COORDINATION OF CARE:  2:18 AM Discussed treatment plan with pt at bedside and pt agreed to plan.  Labs (all labs ordered are listed, but only abnormal results are displayed) Labs Reviewed  BASIC METABOLIC PANEL - Abnormal; Notable for the following:       Result Value   Glucose, Bld 145 (*)    All other components within normal limits  CBC  I-STAT TROPOININ, ED    EKG  EKG Interpretation  Date/Time:  Tuesday July 21 2016 01:05:01 EST Ventricular Rate:  87 PR Interval:  164 QRS Duration: 84 QT Interval:  360 QTC Calculation: 433 R Axis:   -16 Text Interpretation:  Normal sinus rhythm Normal ECG No significant change since last tracing  Confirmed by Glynn Octave [phone]) on 07/21/2016 1:56:22 AM       Radiology Dg Chest 2 View  Result Date: 07/21/2016 CLINICAL DATA:  Chest pain radiating into the left upper extremity for 1 hour EXAM: CHEST  2 VIEW COMPARISON:  10/24/2012 FINDINGS: The heart size and mediastinal contours are within normal limits. Both lungs are clear. The visualized skeletal structures are unremarkable. IMPRESSION: No active cardiopulmonary disease. Electronically Signed   By: [name] M.D.   On: 07/21/2016 01:56    Procedures Procedures (including critical care time)  Medications Ordered in ED Medications - No data to display   Initial Impression / Assessment and Plan / ED Course  I have reviewed the triage vital signs and the nursing notes.  Pertinent labs & imaging results that were available during my care of the patient  were reviewed by me and considered in my medical decision making (see chart for details).  Clinical Course     Patient presents to the ED for chest pain.  History is concerning for ACS, he had associated abnormal breathing and L arm numbness.  He already took ASA, will order nitro in case the pain returns. EKG does not show any ischemia.  Patient will require admission for cardiac work up.  2:51 AM Dr. Hal Hope accepts the patient for tele for further care.    Final Clinical Impressions(s) / ED Diagnoses   Final diagnoses:  None    New Prescriptions New Prescriptions   No medications on file     I personally performed the services described in this documentation, which was scribed in my presence. The recorded information has been reviewed and is accurate.      Everlene Balls, MD 07/21/16 [phone]

## 2016-07-21 NOTE — ED Notes (Signed)
Patient states he woke up to a sharp stabbing pain in his left breast to his back.  Went down his arm and made his fingers numb.  Patient took 81 mg ASA before leaving house.  Patient is A&Ox4 at this time.

## 2016-07-21 NOTE — ED Notes (Signed)
Patient taken to XRAY

## 2016-07-21 NOTE — Progress Notes (Signed)
Pt discharged home. Discharge instructions have been gone over with the patient. IV's removed. Pt given unit number and told to call if they have any concerns regarding their discharge instructions. Kalik Hoare V, RN   

## 2016-07-23 ENCOUNTER — Ambulatory Visit (INDEPENDENT_AMBULATORY_CARE_PROVIDER_SITE_OTHER): Payer: BLUE CROSS/BLUE SHIELD | Admitting: Internal Medicine

## 2016-07-23 ENCOUNTER — Encounter: Payer: Self-pay | Admitting: Internal Medicine

## 2016-07-23 ENCOUNTER — Ambulatory Visit (INDEPENDENT_AMBULATORY_CARE_PROVIDER_SITE_OTHER)
Admission: RE | Admit: 2016-07-23 | Discharge: 2016-07-23 | Disposition: A | Discharge status: Home / Self Care | Payer: BLUE CROSS/BLUE SHIELD | Source: Ambulatory Visit | Attending: Internal Medicine | Admitting: Internal Medicine

## 2016-07-23 VITALS — BP 132/76 | HR 64 | Temp 98.2°F | Resp 20 | Wt 272.0 lb

## 2016-07-23 DIAGNOSIS — M545 Low back pain: Secondary | ICD-10-CM

## 2016-07-23 DIAGNOSIS — Z0001 Encounter for general adult medical examination with abnormal findings: Secondary | ICD-10-CM

## 2016-07-23 DIAGNOSIS — M47816 Spondylosis without myelopathy or radiculopathy, lumbar region: Secondary | ICD-10-CM | POA: Diagnosis not present

## 2016-07-23 DIAGNOSIS — I10 Essential (primary) hypertension: Secondary | ICD-10-CM | POA: Diagnosis not present

## 2016-07-23 DIAGNOSIS — M5412 Radiculopathy, cervical region: Secondary | ICD-10-CM

## 2016-07-23 DIAGNOSIS — M546 Pain in thoracic spine: Secondary | ICD-10-CM

## 2016-07-23 MED ORDER — LISINOPRIL 5 MG PO TABS: 5.0000 mg | ORAL_TABLET | Freq: Every day | ORAL | 3 refills | Status: DC

## 2016-07-23 MED ORDER — DICLOFENAC SODIUM 75 MG PO TBEC: 75.0000 mg | DELAYED_RELEASE_TABLET | Freq: Two times a day (BID) | ORAL | 3 refills | Status: DC | PRN

## 2016-07-23 MED ORDER — CYCLOBENZAPRINE HCL 5 MG PO TABS: 5.0000 mg | ORAL_TABLET | Freq: Three times a day (TID) | ORAL | 2 refills | Status: DC | PRN

## 2016-07-23 NOTE — Patient Instructions (Signed)
Please take all new medication as prescribed - the anti-inflammatory, and muscle relaxer  Please continue all other medications as before, and refills have been done if requested.  Please have the pharmacy call with any other refills you may need.  Please continue your efforts at being more active, low cholesterol diet, and weight control.  You are otherwise up to date with prevention measures today.  Please keep your appointments with your specialists as you may have planned  Please go to the XRAY Department in the Basement (go straight as you get off the elevator) for the x-ray testing  You will be contacted regarding the referral for: MRI for neck only  You will be contacted by phone if any changes need to be made immediately.  Otherwise, you will receive a letter about your results with an explanation, but please check with MyChart first.  Please remember to sign up for MyChart if you have not done so, as this will be important to you in the future with finding out test results, communicating by private email, and scheduling acute appointments online when needed.  Please return in 6 months, or sooner if needed, with Lab testing done 3-5 days before

## 2016-07-23 NOTE — Progress Notes (Signed)
Subjective:    Patient ID: Jerome [name], male    DOB: 06-25-1971, 45 y.o.   MRN: [id number]  HPI  Here for wellness and f/u;  Overall doing ok;  Pt denies  worsening SOB, DOE, wheezing, orthopnea, PND, worsening LE edema, palpitations, dizziness or syncope.  Pt denies neurological change such as new headache, facial or extremity weakness.  Pt denies polydipsia, polyuria, or low sugar symptoms. Pt states overall good compliance with treatment and medications, good tolerability, and has been trying to follow appropriate diet.  Pt denies worsening depressive symptoms, suicidal ideation or panic. No fever, night sweats, wt loss, loss of appetite, or other constitutional symptoms.  Pt states good ability with ADL's, has low fall risk, home safety reviewed and adequate, no other significant changes in hearing or vision, and only occasionally active with exercise.  Tolerating off metformin after metformin and excellent a1c last visit.  Declines immunizations today. Was recently hospd: Cardiology saw, did Cor CT which turned out neg.   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  Now with < 1 wk onset left neck and arm pain, with intermittent tingling and numbness, as well as mild distal weakness. Also has some left upper thoracic paravertebral tender, worse to move the left arm and shoulder. Pt continues to have recurring LBP without change in severity, bowel or bladder change, fever, wt loss,  worsening LE pain/numbness/weakness, gait change or falls.   Past Medical History:  Diagnosis Date  . ALLERGIC RHINITIS 04/07/2007  . ASTHMA 04/07/2007  . CONSTIPATION 10/29/2009  . Diabetes mellitus type II, non insulin dependent ([place]) 03/08/2011   Improved w/ weight loss, A1c followed by primary care  . GERD 04/07/2007  . HEMATOCHEZIA 10/29/2009  . HYPERTENSION 04/01/2007  . LOW BACK PAIN 04/07/2007  . Morbid obesity ([place]) 04/07/2007   Past Surgical History:  Procedure Laterality Date  .  CHOLECYSTECTOMY  2014    reports that he has been smoking.  He has been smoking about 0.10 packs per day. He has never used smokeless tobacco. He reports that he drinks alcohol. He reports that he does not use drugs. family history includes Diabetes Mellitus II in his father; Prostate cancer in his father; Stroke in his other; Throat cancer in his maternal uncle. No Known Allergies Current Outpatient Prescriptions on File Prior to Visit  Medication Sig Dispense Refill  . albuterol (PROVENTIL HFA;VENTOLIN HFA) 108 (90 BASE) MCG/ACT inhaler Inhale 2 puffs into the lungs every 6 (six) hours as needed for wheezing. 1 Inhaler 11  . glucose blood test strip Use as instructed 100 each 12  . glucose monitoring kit (FREESTYLE) monitoring kit 1 each by Does not apply route as needed for other. 1 each 0  . Lancets MISC Use as directed 1 per day for cbg check 100 each 12   No current facility-administered medications on file prior to visit.     Review of Systems Constitutional: Negative for increased diaphoresis, or other activity, appetite or siginficant weight change other than noted HENT: Negative for worsening hearing loss, ear pain, facial swelling, mouth sores and neck stiffness.   Eyes: Negative for other worsening pain, redness or visual disturbance.  Respiratory: Negative for choking or stridor Cardiovascular: Negative for other chest pain and palpitations.  Gastrointestinal: Negative for worsening diarrhea, blood in stool, or abdominal distention Genitourinary: Negative for hematuria, flank pain or change in urine volume.  Musculoskeletal: Negative for myalgias or other joint complaints.  Skin: Negative for  other color change and wound or drainage.  Neurological: Negative for syncope and numbness. other than noted Hematological: Negative for adenopathy. or other swelling Psychiatric/Behavioral: Negative for hallucinations, SI, self-injury, decreased concentration or other worsening  agitation.  All other system neg per pt    Objective:   Physical Exam BP 132/76   Pulse 64   Temp 98.2 F (36.8 C) (Oral)   Resp 20   Wt 272 lb (123.4 kg)   SpO2 96%   BMI 41.36 kg/m  VS noted,  Constitutional: Pt is oriented to person, place, and time. Appears well-developed and well-nourished, in no significant distress Head: Normocephalic and atraumatic  Eyes: Conjunctivae and EOM are normal. Pupils are equal, round, and reactive to light Right Ear: External ear normal.  Left Ear: External ear normal Nose: Nose normal.  Mouth/Throat: Oropharynx is clear and moist  Neck: Normal range of motion. Neck supple. No JVD present. No tracheal deviation present or significant neck LA or mass Cardiovascular: Normal rate, regular rhythm, normal heart sounds and intact distal pulses.   Pulmonary/Chest: Effort normal and breath sounds without rales or wheezing  Abdominal: Soft. Bowel sounds are normal. NT. No HSM  Musculoskeletal: Normal range of motion. Exhibits no edema, has mild tender left thoracic and neck paravertebral tender, spine nontender midline throughout Lymphadenopathy: Has no cervical adenopathy.  Neurological: Pt is alert and oriented to person, place, and time. Pt has normal reflexes. No cranial nerve deficit. Motor 4+/5 LUE o/w intact, cn 2-12 intact, sens/dtr intact Skin: Skin is warm and dry. No rash noted or new ulcers Psychiatric:  Has normal mood and affect. Behavior is normal.  No other new exam findings    Assessment & Plan:

## 2016-07-23 NOTE — Progress Notes (Signed)
Pre visit review using our clinic review tool, if applicable. No additional management support is needed unless otherwise documented below in the visit note. 

## 2016-07-26 NOTE — Assessment & Plan Note (Signed)
stable overall by history and exam, recent data reviewed with pt, and pt to continue medical treatment as before,  to f/u any worsening symptoms or concerns' BP Readings from Last 3 Encounters:  07/23/16 132/76  07/21/16 129/71  10/31/15 134/82

## 2016-07-26 NOTE — Assessment & Plan Note (Signed)
Mild, suspect msk, for nsaid prn, flexeril prn,  to f/u any worsening symptoms or concerns

## 2016-07-26 NOTE — Assessment & Plan Note (Addendum)
New onset with neuro change, for MRI c spine , ortho referral  In addition to the time spent performing CPE, I spent an additional 20 minutes face to face,in which greater than 50% of this time was spent in counseling and coordination of care for patient's acute illness as documented.

## 2016-07-26 NOTE — Assessment & Plan Note (Signed)

## 2016-07-26 NOTE — Assessment & Plan Note (Signed)
Exam benign, suspect undelrying lumbar djd or ddd, for tx as above

## 2016-08-14 ENCOUNTER — Inpatient Hospital Stay: Admission: RE | Admit: 2016-08-14 | Payer: BLUE CROSS/BLUE SHIELD | Source: Ambulatory Visit

## 2017-01-21 ENCOUNTER — Ambulatory Visit: Payer: BLUE CROSS/BLUE SHIELD | Admitting: Internal Medicine

## 2017-05-03 ENCOUNTER — Other Ambulatory Visit (INDEPENDENT_AMBULATORY_CARE_PROVIDER_SITE_OTHER): Payer: BLUE CROSS/BLUE SHIELD

## 2017-05-03 DIAGNOSIS — Z0000 Encounter for general adult medical examination without abnormal findings: Secondary | ICD-10-CM

## 2017-05-03 LAB — URINALYSIS, ROUTINE W REFLEX MICROSCOPIC
Bilirubin Urine: NEGATIVE
Hgb urine dipstick: NEGATIVE
KETONES UR: NEGATIVE
Leukocytes, UA: NEGATIVE
NITRITE: NEGATIVE
PH: 5.5 (ref 5.0–8.0)
RBC / HPF: NONE SEEN (ref 0–?)
SPECIFIC GRAVITY, URINE: 1.025 (ref 1.000–1.030)
Total Protein, Urine: NEGATIVE
Urine Glucose: NEGATIVE
Urobilinogen, UA: 0.2 (ref 0.0–1.0)

## 2017-05-03 LAB — TSH: TSH: 0.97 u[IU]/mL (ref 0.35–4.50)

## 2017-05-03 LAB — LIPID PANEL
CHOLESTEROL: 126 mg/dL (ref 0–200)
HDL: 51.8 mg/dL (ref >39.00)
LDL CALC: 58 mg/dL (ref 0–99)
NONHDL: 74.26
Total CHOL/HDL Ratio: 2
Triglycerides: 82 mg/dL (ref 0.0–149.0)
VLDL: 16.4 mg/dL (ref 0.0–40.0)

## 2017-05-03 LAB — BASIC METABOLIC PANEL
BUN: 9 mg/dL (ref 6–23)
CO2: 27 mEq/L (ref 19–32)
CREATININE: 0.97 mg/dL (ref 0.40–1.50)
Calcium: 9.2 mg/dL (ref 8.4–10.5)
Chloride: 106 mEq/L (ref 96–112)
GFR: 106.68 mL/min (ref >60.00)
GLUCOSE: 130 mg/dL — AB (ref 70–99)
Potassium: 4 mEq/L (ref 3.5–5.1)
Sodium: 139 mEq/L (ref 135–145)

## 2017-05-03 LAB — CBC WITH DIFFERENTIAL/PLATELET
BASOS ABS: 0 10*3/uL (ref 0.0–0.1)
BASOS PCT: 0.4 % (ref 0.0–3.0)
EOS ABS: 0.2 10*3/uL (ref 0.0–0.7)
EOS PCT: 3.5 % (ref 0.0–5.0)
HCT: 44 % (ref 39.0–52.0)
HEMOGLOBIN: 14.7 g/dL (ref 13.0–17.0)
Lymphocytes Relative: 35.7 % (ref 12.0–46.0)
Lymphs Abs: 1.9 10*3/uL (ref 0.7–4.0)
MCHC: 33.4 g/dL (ref 30.0–36.0)
MCV: 89.8 fl (ref 78.0–100.0)
MONO ABS: 0.4 10*3/uL (ref 0.1–1.0)
MONOS PCT: 7.6 % (ref 3.0–12.0)
Neutro Abs: 2.9 10*3/uL (ref 1.4–7.7)
Neutrophils Relative %: 52.8 % (ref 43.0–77.0)
Platelets: 285 10*3/uL (ref 150.0–400.0)
RBC: 4.91 Mil/uL (ref 4.22–5.81)
RDW: 12.2 % (ref 11.5–15.5)
WBC: 5.4 10*3/uL (ref 4.0–10.5)

## 2017-05-03 LAB — MICROALBUMIN / CREATININE URINE RATIO
CREATININE, U: 185.9 mg/dL
MICROALB UR: 0.7 mg/dL (ref 0.0–1.9)
MICROALB/CREAT RATIO: 0.4 mg/g (ref 0.0–30.0)

## 2017-05-03 LAB — HEPATIC FUNCTION PANEL
ALK PHOS: 56 U/L (ref 39–117)
ALT: 16 U/L (ref 0–53)
AST: 12 U/L (ref 0–37)
Albumin: 3.9 g/dL (ref 3.5–5.2)
BILIRUBIN DIRECT: 0.1 mg/dL (ref 0.0–0.3)
BILIRUBIN TOTAL: 0.6 mg/dL (ref 0.2–1.2)
TOTAL PROTEIN: 6.5 g/dL (ref 6.0–8.3)

## 2017-05-03 LAB — PSA: PSA: 1.31 ng/mL (ref 0.10–4.00)

## 2017-05-03 LAB — HEMOGLOBIN A1C: HEMOGLOBIN A1C: 5.1 % (ref 4.6–6.5)

## 2017-05-04 ENCOUNTER — Encounter: Payer: Self-pay | Admitting: Internal Medicine

## 2017-05-04 ENCOUNTER — Ambulatory Visit (INDEPENDENT_AMBULATORY_CARE_PROVIDER_SITE_OTHER): Payer: BLUE CROSS/BLUE SHIELD | Admitting: Internal Medicine

## 2017-05-04 VITALS — BP 112/74 | HR 68 | Temp 98.1°F | Ht 68.0 in | Wt 274.0 lb

## 2017-05-04 DIAGNOSIS — B20 Human immunodeficiency virus [HIV] disease: Secondary | ICD-10-CM

## 2017-05-04 DIAGNOSIS — I10 Essential (primary) hypertension: Secondary | ICD-10-CM

## 2017-05-04 DIAGNOSIS — R739 Hyperglycemia, unspecified: Secondary | ICD-10-CM | POA: Diagnosis not present

## 2017-05-04 DIAGNOSIS — Z0000 Encounter for general adult medical examination without abnormal findings: Secondary | ICD-10-CM

## 2017-05-04 MED ORDER — ASPIRIN EC 81 MG PO TBEC: 81.0000 mg | DELAYED_RELEASE_TABLET | Freq: Every day | ORAL | 3 refills | Status: AC

## 2017-05-04 NOTE — Progress Notes (Signed)
Subjective:    Patient ID: Jerome [name], male    DOB: [birth year], 46 y.o.   MRN: [id number]  HPI  Here for wellness and f/u;  Overall doing ok;  Pt denies Chest pain, worsening SOB, DOE, wheezing, orthopnea, PND, worsening LE edema, palpitations, dizziness or syncope.  Pt denies neurological change such as new headache, facial or extremity weakness.  Pt denies polydipsia, polyuria, or low sugar symptoms. Pt states overall good compliance with treatment and medications, good tolerability, and has been trying to follow appropriate diet.  Pt denies worsening depressive symptoms, suicidal ideation or panic. No fever, night sweats, wt loss, loss of appetite, or other constitutional symptoms.  Pt states good ability with ADL's, has low fall risk, home safety reviewed and adequate, no other significant changes in hearing or vision, and not active with exercise due to alternating day/night work schedule and fatigue.  Plans to be more active soon with new job.  He is wanting to get off all meds if possible except asa BP Readings from Last 3 Encounters:  05/04/17 112/74  07/23/16 132/76  07/21/16 129/71   Wt Readings from Last 3 Encounters:  05/04/17 274 lb (124.3 kg)  07/23/16 272 lb (123.4 kg)  07/21/16 270 lb 1.6 oz (122.5 kg)  Declines flu and pnuemonia shots.  Past Medical History:  Diagnosis Date  . ALLERGIC RHINITIS 04/07/2007  . ASTHMA 04/07/2007  . CONSTIPATION 10/29/2009  . Diabetes mellitus type II, non insulin dependent ([place]) 03/08/2011   Improved w/ weight loss, A1c followed by primary care  . GERD 04/07/2007  . HEMATOCHEZIA 10/29/2009  . HYPERTENSION 04/01/2007  . LOW BACK PAIN 04/07/2007  . Morbid obesity ([place]) 04/07/2007   Past Surgical History:  Procedure Laterality Date  . CHOLECYSTECTOMY  2014    reports that he has been smoking.  He has been smoking about 0.10 packs per day. He has never used smokeless tobacco. He reports that he drinks alcohol. He reports that he does not use  drugs. family history includes Diabetes Mellitus II in his father; Prostate cancer in his father; Stroke in his other; Throat cancer in his maternal uncle. No Known Allergies No current outpatient prescriptions on file prior to visit.   No current facility-administered medications on file prior to visit.     Review of Systems Constitutional: Negative for other unusual diaphoresis, sweats, appetite or weight changes HENT: Negative for other worsening hearing loss, ear pain, facial swelling, mouth sores or neck stiffness.   Eyes: Negative for other worsening pain, redness or other visual disturbance.  Respiratory: Negative for other stridor or swelling Cardiovascular: Negative for other palpitations or other chest pain  Gastrointestinal: Negative for worsening diarrhea or loose stools, blood in stool, distention or other pain Genitourinary: Negative for hematuria, flank pain or other change in urine volume.  Musculoskeletal: Negative for myalgias or other joint swelling.  Skin: Negative for other color change, or other wound or worsening drainage.  Neurological: Negative for other syncope or numbness. Hematological: Negative for other adenopathy or swelling Psychiatric/Behavioral: Negative for hallucinations, other worsening agitation, SI, self-injury, or new decreased concentration All other system neg per pt    Objective:   Physical Exam BP 112/74   Pulse 68   Temp 98.1 F (36.7 C) (Oral)   Ht 5\' 8"  (1.727 m)   Wt 274 lb (124.3 kg)   SpO2 100%   BMI 41.66 kg/m  VS noted,  Constitutional: Pt is oriented to person, place, and time. Appears well-developed and  well-nourished, in no significant distress and comfortable Head: Normocephalic and atraumatic  Eyes: Conjunctivae and EOM are normal. Pupils are equal, round, and reactive to light Right Ear: External ear normal without discharge Left Ear: External ear normal without discharge Nose: Nose without discharge or  deformity Mouth/Throat: Oropharynx is without other ulcerations and moist  Neck: Normal range of motion. Neck supple. No JVD present. No tracheal deviation present or significant neck LA or mass Cardiovascular: Normal rate, regular rhythm, normal heart sounds and intact distal pulses.   Pulmonary/Chest: WOB normal and breath sounds without rales or wheezing  Abdominal: Soft. Bowel sounds are normal. NT. No HSM  Musculoskeletal: Normal range of motion. Exhibits no edema Lymphadenopathy: Has no other cervical adenopathy.  Neurological: Pt is alert and oriented to person, place, and time. Pt has normal reflexes. No cranial nerve deficit. Motor grossly intact, Gait intact Skin: Skin is warm and dry. No rash noted or new ulcerations Psychiatric:  Has normal mood and affect. Behavior is normal without agitation No other exam findings Lab Results  Component Value Date   WBC 5.4 05/03/2017   HGB 14.7 05/03/2017   HCT 44.0 05/03/2017   PLT 285.0 05/03/2017   GLUCOSE 130 (H) 05/03/2017   CHOL 126 05/03/2017   TRIG 82.0 05/03/2017   HDL 51.80 05/03/2017   LDLCALC 58 05/03/2017   ALT 16 05/03/2017   AST 12 05/03/2017   NA 139 05/03/2017   K 4.0 05/03/2017   CL 106 05/03/2017   CREATININE 0.97 05/03/2017   BUN 9 05/03/2017   CO2 27 05/03/2017   TSH 0.97 05/03/2017   PSA 1.31 05/03/2017   HGBA1C 5.1 05/03/2017   MICROALBUR 0.7 05/03/2017      Assessment & Plan:

## 2017-05-04 NOTE — Assessment & Plan Note (Signed)

## 2017-05-04 NOTE — Assessment & Plan Note (Signed)
Mild, cont diet off OHA for now, cont wt loss efforts

## 2017-05-04 NOTE — Patient Instructions (Signed)
OK to stay off the lisinopril 5 mg  Please continue all other medications as before, and refills have been done if requested.  Please have the pharmacy call with any other refills you may need.  Please continue your efforts at being more active, low cholesterol diet, and weight control.  You are otherwise up to date with prevention measures today.  Please keep your appointments with your specialists as you may have planned  Please return in 1 year for your yearly visit, or sooner if needed, with Lab testing done 3-5 days before

## 2017-05-04 NOTE — Assessment & Plan Note (Signed)
Improved recently, ok to d/c minimal dose lisinopril which was originally for renoprotective effect

## 2017-06-01 ENCOUNTER — Telehealth: Payer: Self-pay | Admitting: Internal Medicine

## 2017-06-01 NOTE — Telephone Encounter (Signed)
Pt would like to transfer from Dr. [name] to Dr. Volanda Napoleon is it okay?

## 2017-06-01 NOTE — Telephone Encounter (Signed)
Ok with me 

## 2017-06-09 NOTE — Telephone Encounter (Signed)
ok 

## 2017-06-09 NOTE — Telephone Encounter (Signed)
Okay per Dr. Volanda Napoleon.

## 2017-06-09 NOTE — Telephone Encounter (Signed)
Pt wife is aware that of the above msg and they will call back when ready.

## 2017-06-09 NOTE — Telephone Encounter (Signed)
Please advise 

## 2017-11-23 DIAGNOSIS — F3289 Other specified depressive episodes: Secondary | ICD-10-CM | POA: Diagnosis not present

## 2017-12-06 DIAGNOSIS — F3289 Other specified depressive episodes: Secondary | ICD-10-CM | POA: Diagnosis not present

## 2018-05-05 ENCOUNTER — Encounter: Payer: BLUE CROSS/BLUE SHIELD | Admitting: Internal Medicine

## 2018-07-01 ENCOUNTER — Telehealth: Payer: Self-pay | Admitting: Internal Medicine

## 2018-07-01 NOTE — Telephone Encounter (Signed)
Spoke with patient. He is not having sx at this time, &He also was not when this sugar was taking over 2 weeks ago. He had some Metformin at home from when he use to be on medication. He has used it.   LOV:05/03/2017 Patient did not want an appointment nor did he want to move it up because he does not have insurance or the $80. I informed him we could try to work something out with the $80 & could maybe do $60. Patient still refused. He said he could to [facility] on Monday to have his A1C checked. If it is good, once he states his new job and gets his insurance he will follow up with Dr.John. Last A1C 5.1 on 05/03/17.

## 2018-07-01 NOTE — Telephone Encounter (Signed)
Appointment has been made for Monday at [facility].

## 2018-07-01 NOTE — Telephone Encounter (Signed)
Copied from [place] [id number]. Topic: Appointment Scheduling - Scheduling Inquiry for Clinic >> Jul 01, 2018  3:02 PM Wynetta Emery, [place] C wrote: Reason for CRM: pt's spouse called in, she said that pt went on a interview and was offered a new job. The jobs concern is that pt's blood sugar reading was 435 (finger prick) pt use to take diabetic medication but was taken off of medication because his A1C improved. Spouse says that now she feels that pt should be back on medication. She scheduled pt ov for PCP next available but would like to know if provider could see him sooner? She said that he is in the process of starting a new job.   If provider is able to see pt sooner please call pt's spouse for scheduling.   Larene Beach Popp - [phone] OR [phone].  We can work the patient sooner, but we need more information on this, when was this taken, having sx?

## 2018-07-01 NOTE — Telephone Encounter (Signed)
Pt's wife calling in.  States that pt was confused when he spoke with someone, that he does need appointment sooner to start job.  Please call pt or wife back at [phone].

## 2018-07-04 ENCOUNTER — Ambulatory Visit: Payer: Self-pay | Admitting: Internal Medicine

## 2018-07-04 NOTE — Telephone Encounter (Signed)
Noted  

## 2018-07-05 ENCOUNTER — Ambulatory Visit: Payer: Self-pay | Admitting: Internal Medicine

## 2018-07-07 ENCOUNTER — Telehealth: Payer: Self-pay | Admitting: Internal Medicine

## 2018-07-07 NOTE — Telephone Encounter (Signed)
Copied from [place] [id number]. Topic: Quick Communication - Rx Refill/Question >> Jul 07, 2018 10:35 AM Selinda Flavin B, NT wrote: **Advised patient that he has not been seen since 04/2017. Advised that he will need an appointment. Patient states that he does not have the money for an appointment at this time. Would like to know if 1 refill could be sent in until he can make an appointment?**  Medication: metFORMIN (GLUCOPHAGE-XR) 500 MG 24 hr tablet  Has the patient contacted their pharmacy? Yes.   (Agent: If no, request that the patient contact the pharmacy for the refill.) (Agent: If yes, when and what did the pharmacy advise?)  Preferred Pharmacy (with phone number or street name): [facility], [place] - [address]: Please be advised that RX refills may take up to 3 business days. We ask that you follow-up with your pharmacy.

## 2018-07-07 NOTE — Telephone Encounter (Signed)
See documentation below / Metformin is not on patients active medication list / LOV 05/04/17 with Dr [name] / Patient refused to make an appointment

## 2018-07-08 ENCOUNTER — Telehealth: Payer: Self-pay | Admitting: Internal Medicine

## 2018-07-08 ENCOUNTER — Ambulatory Visit (INDEPENDENT_AMBULATORY_CARE_PROVIDER_SITE_OTHER): Payer: Self-pay | Admitting: Internal Medicine

## 2018-07-08 ENCOUNTER — Encounter: Payer: Self-pay | Admitting: Internal Medicine

## 2018-07-08 VITALS — BP 126/86 | HR 76 | Temp 98.4°F | Ht 68.0 in | Wt 266.0 lb

## 2018-07-08 DIAGNOSIS — R739 Hyperglycemia, unspecified: Secondary | ICD-10-CM

## 2018-07-08 DIAGNOSIS — E119 Type 2 diabetes mellitus without complications: Secondary | ICD-10-CM

## 2018-07-08 DIAGNOSIS — I10 Essential (primary) hypertension: Secondary | ICD-10-CM

## 2018-07-08 DIAGNOSIS — Z0000 Encounter for general adult medical examination without abnormal findings: Secondary | ICD-10-CM

## 2018-07-08 LAB — POCT GLYCOSYLATED HEMOGLOBIN (HGB A1C)
HEMOGLOBIN A1C: 0 % (ref 4.0–5.6)
HEMOGLOBIN A1C: 9.3 % — AB (ref 4.0–5.6)
HbA1c, POC (controlled diabetic range): 0 % (ref 0.0–7.0)
HbA1c, POC (prediabetic range): 0 % — AB (ref 5.7–6.4)

## 2018-07-08 MED ORDER — METFORMIN HCL ER 500 MG PO TB24: 1000.0000 mg | ORAL_TABLET | Freq: Every day | ORAL | 3 refills | Status: DC

## 2018-07-08 NOTE — Patient Instructions (Signed)
Your Hgba1c was 9.3 today  Please take all new medication as prescribed - the metformin ER 500 mg - 2 pills in the AM  Please continue all other medications as before, and refills have been done if requested.  Please have the pharmacy call with any other refills you may need.  Please continue your efforts at being more active, low cholesterol diabetic diet, and weight control.  Please keep your appointments with your specialists as you may have planned  You are given the work note today  Please return in 6 months, or sooner if needed, with Lab testing done 3-5 days before\

## 2018-07-08 NOTE — Telephone Encounter (Signed)
Copied from [place] [id number]. Topic: Quick Communication - Rx Refill/Question >> Jul 08, 2018  4:38 PM Reyne Dumas L wrote: Medication:  AccuCheck Aviva Plus test strips  Has the patient contacted their pharmacy? No - states that he has never gotten these through pharmacy before.  Pt states he has no strips left. (Agent: If no, request that the patient contact the pharmacy for the refill.) (Agent: If yes, when and what did the pharmacy advise?)  Preferred Pharmacy (with phone number or street name): [facility] [place], [place] - [address] [phone] (Phone) [phone] (Fax)  Agent: Please be advised that RX refills may take up to 3 business days. We ask that you follow-up with your pharmacy.

## 2018-07-08 NOTE — Telephone Encounter (Signed)
Per Shirron and Dr [name], patient can no longer be scheduled with Dr [name] due to multiple cancellations.

## 2018-07-08 NOTE — Telephone Encounter (Signed)
Please advise. PEC spoke to him yesterday and scheduled an appointment. Appears patient called back later saying that he does not have the money to come in and would like a refill on this medication.

## 2018-07-08 NOTE — Progress Notes (Signed)
Subjective:    Patient ID: Jerome [name], male    DOB: 01/12/1971, 47 y.o.   MRN: [id number]  HPI  Here to f/u; overall doing ok,  Pt denies chest pain, increasing sob or doe, wheezing, orthopnea, PND, increased LE swelling, palpitations, dizziness or syncope.  Pt denies new neurological symptoms such as new headache, or facial or extremity weakness or numbness.  Pt denies polydipsia, polyuria, or low sugar episode.  Pt states overall good compliance with meds, mostly trying to follow appropriate diet, with wt overall stable,  but little exercise however.  Recent physical for work application with CBG fingerstick at 435 (post prandial about 1 hour), so restarted metformin Er 500 qd until came here.  Also Ketones in urine vs prot or sugar?  Lost job in June, stressed, had much worse diet, little to no exercise.  Since restarting the metformin, CBG's recently at home 143-196. Applied and offered job at [employer] pending eval and treatment, needs OK for start work note today, just to document he has been evaluated Wt Readings from Last 3 Encounters:  07/08/18 266 lb (120.7 kg)  05/04/17 274 lb (124.3 kg)  07/23/16 272 lb (123.4 kg)   BP Readings from Last 3 Encounters:  07/08/18 126/86  05/04/17 112/74  07/23/16 132/76   Past Medical History:  Diagnosis Date  . ALLERGIC RHINITIS 04/07/2007  . ASTHMA 04/07/2007  . CONSTIPATION 10/29/2009  . Diabetes mellitus type II, non insulin dependent ([place]) 03/08/2011   Improved w/ weight loss, A1c followed by primary care  . GERD 04/07/2007  . HEMATOCHEZIA 10/29/2009  . HYPERTENSION 04/01/2007  . LOW BACK PAIN 04/07/2007  . Morbid obesity ([place]) 04/07/2007   Past Surgical History:  Procedure Laterality Date  . CHOLECYSTECTOMY  2014    reports that he has been smoking. He has been smoking about 0.10 packs per day. He has never used smokeless tobacco. He reports that he drinks alcohol. He reports that he does not use drugs. family history includes Diabetes  Mellitus II in his father; Prostate cancer in his father; Stroke in his other; Throat cancer in his maternal uncle. No Known Allergies Current Outpatient Medications on File Prior to Visit  Medication Sig Dispense Refill  . aspirin EC 81 MG tablet Take 1 tablet (81 mg total) by mouth daily. 90 tablet 3   No current facility-administered medications on file prior to visit.    Review of Systems  Constitutional: Negative for other unusual diaphoresis or sweats HENT: Negative for ear discharge or swelling Eyes: Negative for other worsening visual disturbances Respiratory: Negative for stridor or other swelling  Gastrointestinal: Negative for worsening distension or other blood Genitourinary: Negative for retention or other urinary change Musculoskeletal: Negative for other MSK pain or swelling Skin: Negative for color change or other new lesions Neurological: Negative for worsening tremors and other numbness  Psychiatric/Behavioral: Negative for worsening agitation or other fatigue All other system neg per pt    Objective:   Physical Exam BP 126/86   Pulse 76   Temp 98.4 F (36.9 C) (Oral)   Ht 5\' 8"  (1.727 m)   Wt 266 lb (120.7 kg)   SpO2 98%   BMI 40.45 kg/m   VS noted,  Constitutional: Pt appears in NAD HENT: Head: NCAT.  Right Ear: External ear normal.  Left Ear: External ear normal.  Eyes: . Pupils are equal, round, and reactive to light. Conjunctivae and EOM are normal Nose: without d/c or deformity Neck: Neck supple. Gross normal  ROM Cardiovascular: Normal rate and regular rhythm.   Pulmonary/Chest: Effort normal and breath sounds without rales or wheezing.  Abd:  Soft, NT, ND, + BS, no organomegaly Neurological: Pt is alert. At baseline orientation, motor grossly intact Skin: Skin is warm. No rashes, other new lesions, no LE edema Psychiatric: Pt behavior is normal without agitation  No other exam findings  Lab Results  Component Value Date   WBC 5.4 05/03/2017     HGB 14.7 05/03/2017   HCT 44.0 05/03/2017   PLT 285.0 05/03/2017   GLUCOSE 130 (H) 05/03/2017   CHOL 126 05/03/2017   TRIG 82.0 05/03/2017   HDL 51.80 05/03/2017   LDLCALC 58 05/03/2017   ALT 16 05/03/2017   AST 12 05/03/2017   NA 139 05/03/2017   K 4.0 05/03/2017   CL 106 05/03/2017   CREATININE 0.97 05/03/2017   BUN 9 05/03/2017   CO2 27 05/03/2017   TSH 0.97 05/03/2017   PSA 1.31 05/03/2017   HGBA1C 5.1 05/03/2017   MICROALBUR 0.7 05/03/2017   Today - POCT - a1c - POCT glycosylated hemoglobin (Hb A1C)  Order: [id number]  Status:  Final result Visible to patient:  No (Not Released) Dx:  Hyperglycemia   Ref Range & Units 11:39 (07/08/18) 99yr ago (05/03/17) 69yr ago (10/29/15) 12yr ago (05/01/15) 43yr ago (10/24/14) 72yr ago (04/25/14) 81yr ago (10/24/13)  Hemoglobin A1C 4.0 - 5.6 % 9.3Abnormal   5.1 R, CM 4.8 R, CM 4.4Low               Assessment & Plan:

## 2018-07-09 NOTE — Assessment & Plan Note (Signed)
stable overall by history and exam, recent data reviewed with pt, and pt to continue medical treatment as before,  to f/u any worsening symptoms or concerns  

## 2018-07-09 NOTE — Assessment & Plan Note (Signed)
Uncontrolled but restarted metformin, o/w stable overall by history and exam, recent data reviewed with pt, and pt to continue medical treatment as before, work on DM diet, declines nutrition referral, gave work note, to f/u any worsening symptoms or concerns, for f/u lab next visit

## 2018-07-11 MED ORDER — GLUCOSE BLOOD VI STRP: 1.0000 | ORAL_STRIP | 2 refills | Status: DC | PRN

## 2018-07-13 ENCOUNTER — Ambulatory Visit: Payer: Self-pay | Admitting: Internal Medicine

## 2018-09-21 IMAGING — DX DG THORACIC SPINE 2V
3 series · 3 of 3 positions shown · non-contrast
Comparison: 07/21/2016

CLINICAL DATA: Low back and mid thoracic pain for 6 months.

EXAM:
THORACIC SPINE 2 VIEWS

[t-spine ap]
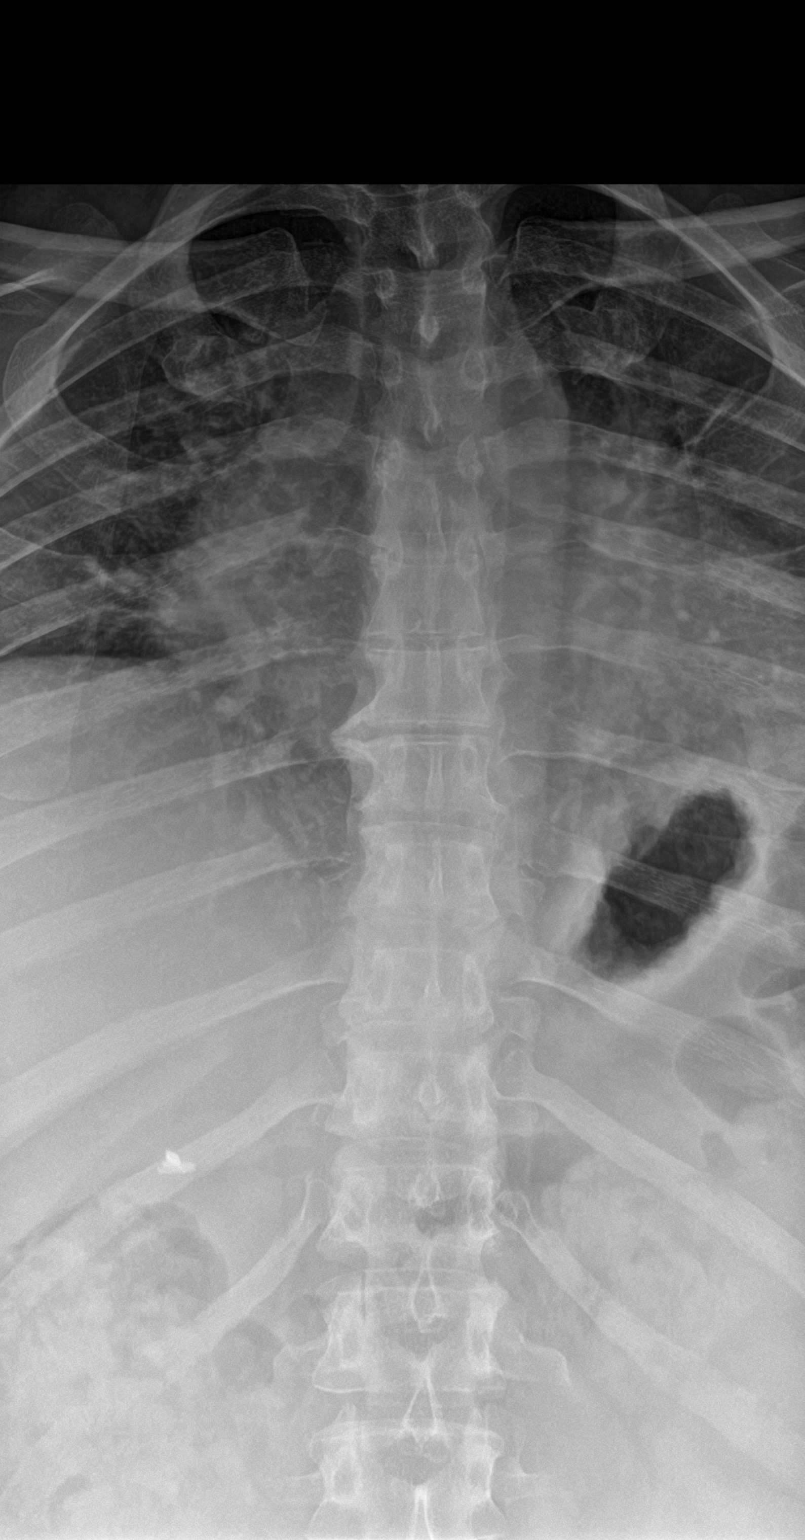

[t-spine lat]
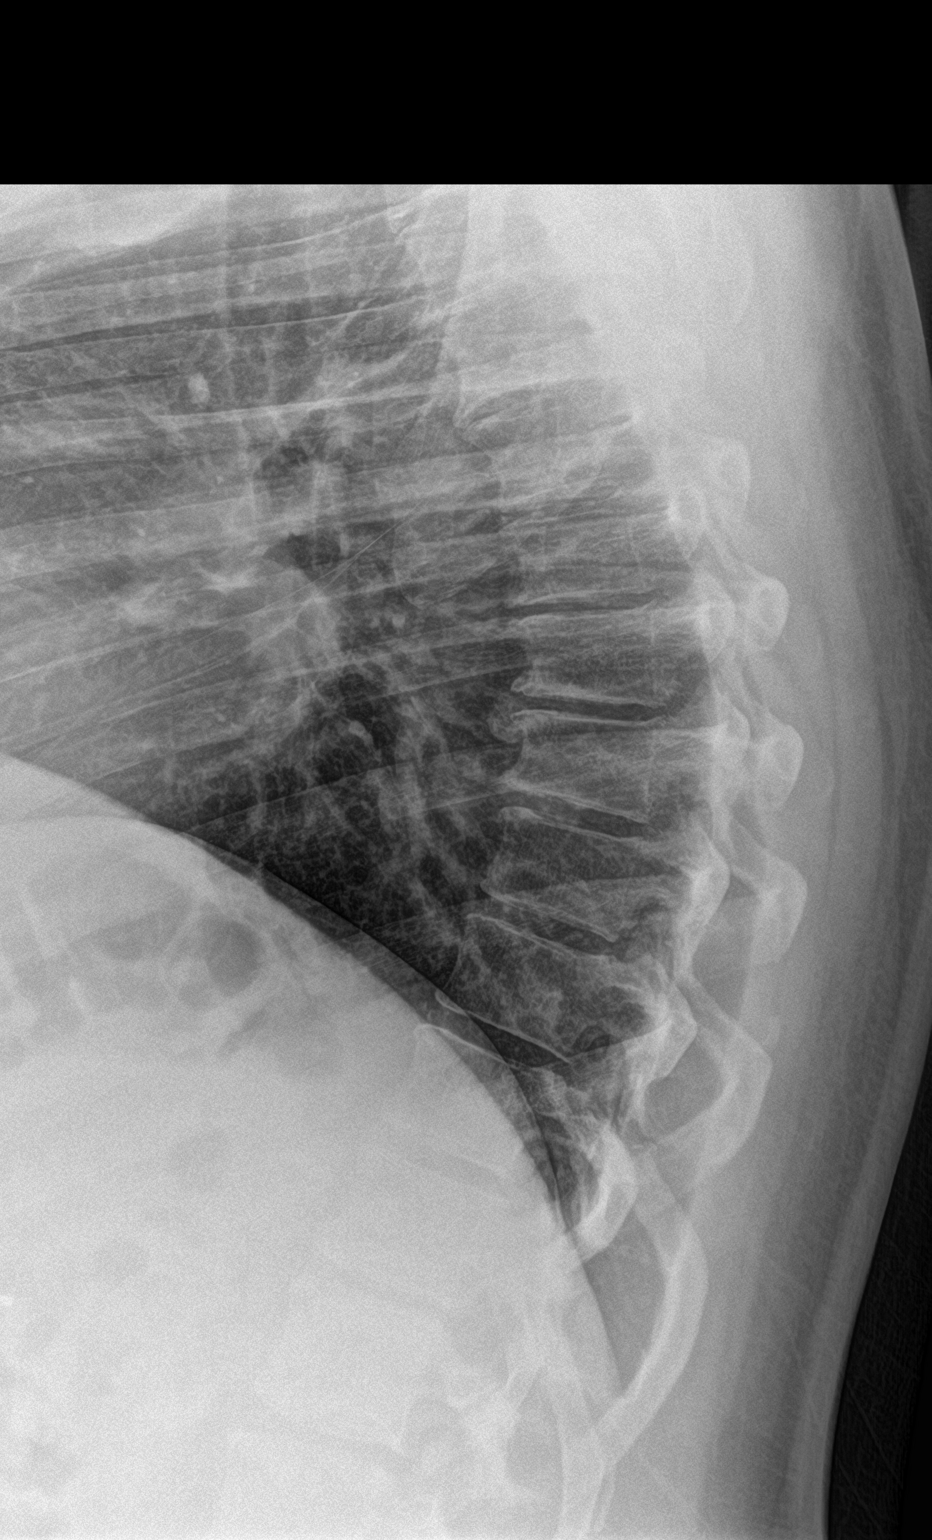

[swimmer]
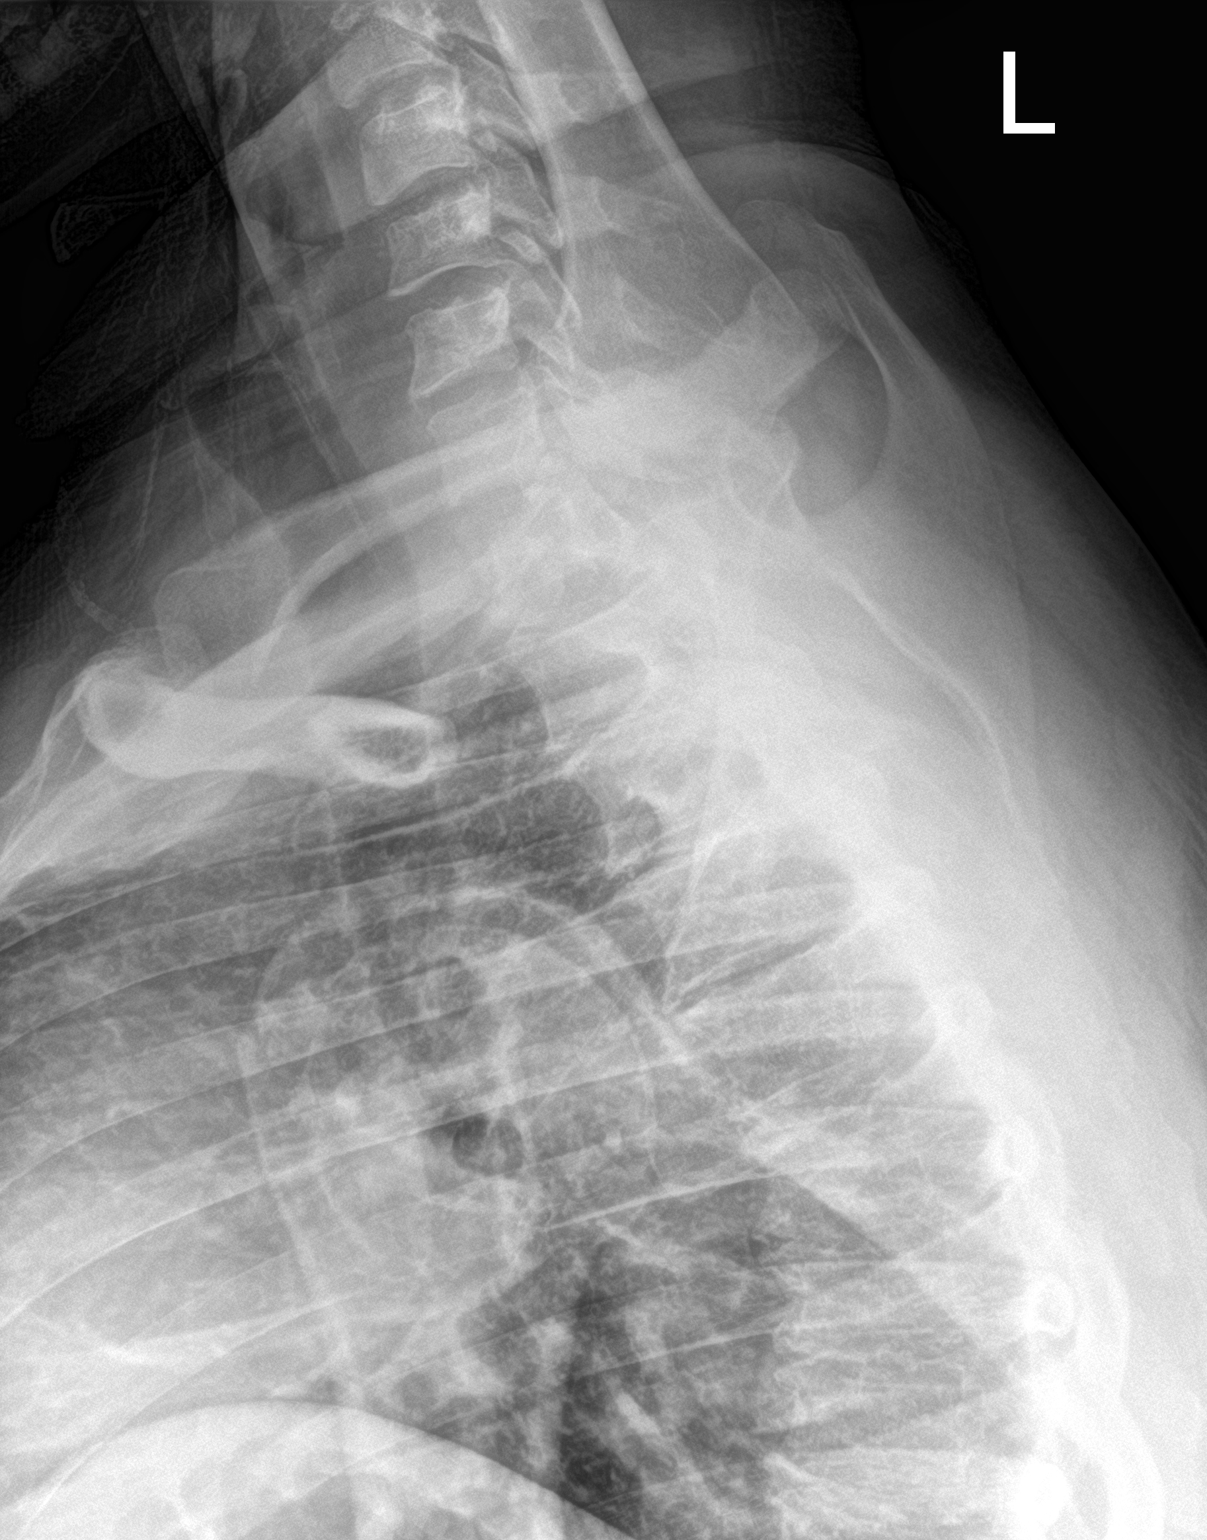

[3 of 3 positions shown; findings below may reference images not displayed]

FINDINGS: Mild degenerative spurring. Normal alignment. No fracture. No focal
bone lesion.
IMPRESSION: No acute findings.

## 2019-01-05 ENCOUNTER — Ambulatory Visit (INDEPENDENT_AMBULATORY_CARE_PROVIDER_SITE_OTHER): Payer: BC Managed Care – PPO | Admitting: Internal Medicine

## 2019-01-05 ENCOUNTER — Telehealth: Payer: Self-pay

## 2019-01-05 ENCOUNTER — Encounter: Payer: Self-pay | Admitting: Internal Medicine

## 2019-01-05 ENCOUNTER — Other Ambulatory Visit: Payer: Self-pay

## 2019-01-05 ENCOUNTER — Other Ambulatory Visit (INDEPENDENT_AMBULATORY_CARE_PROVIDER_SITE_OTHER): Payer: BC Managed Care – PPO

## 2019-01-05 VITALS — BP 132/84 | HR 95 | Temp 98.6°F | Ht 68.0 in | Wt 282.0 lb

## 2019-01-05 DIAGNOSIS — Z0000 Encounter for general adult medical examination without abnormal findings: Secondary | ICD-10-CM

## 2019-01-05 DIAGNOSIS — Z23 Encounter for immunization: Secondary | ICD-10-CM

## 2019-01-05 DIAGNOSIS — Z125 Encounter for screening for malignant neoplasm of prostate: Secondary | ICD-10-CM | POA: Diagnosis not present

## 2019-01-05 DIAGNOSIS — E119 Type 2 diabetes mellitus without complications: Secondary | ICD-10-CM | POA: Diagnosis not present

## 2019-01-05 LAB — URINALYSIS, ROUTINE W REFLEX MICROSCOPIC
Bilirubin Urine: NEGATIVE
Hgb urine dipstick: NEGATIVE
Ketones, ur: NEGATIVE
Leukocytes,Ua: NEGATIVE
Nitrite: NEGATIVE
Specific Gravity, Urine: 1.025 (ref 1.000–1.030)
Total Protein, Urine: NEGATIVE
Urine Glucose: NEGATIVE
Urobilinogen, UA: 0.2 (ref 0.0–1.0)
pH: 5.5 (ref 5.0–8.0)

## 2019-01-05 LAB — LIPID PANEL
Cholesterol: 131 mg/dL (ref 0–200)
HDL: 45.6 mg/dL (ref >39.00)
LDL Cholesterol: 61 mg/dL (ref 0–99)
NonHDL: 85.12
Total CHOL/HDL Ratio: 3
Triglycerides: 122 mg/dL (ref 0.0–149.0)
VLDL: 24.4 mg/dL (ref 0.0–40.0)

## 2019-01-05 LAB — HEPATIC FUNCTION PANEL
ALT: 48 U/L (ref 0–53)
AST: 29 U/L (ref 0–37)
Albumin: 4.1 g/dL (ref 3.5–5.2)
Alkaline Phosphatase: 60 U/L (ref 39–117)
Bilirubin, Direct: 0.2 mg/dL (ref 0.0–0.3)
Total Bilirubin: 0.7 mg/dL (ref 0.2–1.2)
Total Protein: 7 g/dL (ref 6.0–8.3)

## 2019-01-05 LAB — BASIC METABOLIC PANEL
BUN: 12 mg/dL (ref 6–23)
CO2: 26 mEq/L (ref 19–32)
Calcium: 9 mg/dL (ref 8.4–10.5)
Chloride: 107 mEq/L (ref 96–112)
Creatinine, Ser: 1.03 mg/dL (ref 0.40–1.50)
GFR: 92.99 mL/min (ref >60.00)
Glucose, Bld: 167 mg/dL — ABNORMAL HIGH (ref 70–99)
Potassium: 4.2 mEq/L (ref 3.5–5.1)
Sodium: 141 mEq/L (ref 135–145)

## 2019-01-05 LAB — CBC WITH DIFFERENTIAL/PLATELET
Basophils Absolute: 0 10*3/uL (ref 0.0–0.1)
Basophils Relative: 0.6 % (ref 0.0–3.0)
Eosinophils Absolute: 0.1 10*3/uL (ref 0.0–0.7)
Eosinophils Relative: 2.6 % (ref 0.0–5.0)
HCT: 43.6 % (ref 39.0–52.0)
Hemoglobin: 14.9 g/dL (ref 13.0–17.0)
Lymphocytes Relative: 37.8 % (ref 12.0–46.0)
Lymphs Abs: 1.9 10*3/uL (ref 0.7–4.0)
MCHC: 34.2 g/dL (ref 30.0–36.0)
MCV: 86.9 fl (ref 78.0–100.0)
Monocytes Absolute: 0.7 10*3/uL (ref 0.1–1.0)
Monocytes Relative: 14.2 % — ABNORMAL HIGH (ref 3.0–12.0)
Neutro Abs: 2.2 10*3/uL (ref 1.4–7.7)
Neutrophils Relative %: 44.8 % (ref 43.0–77.0)
Platelets: 241 10*3/uL (ref 150.0–400.0)
RBC: 5.01 Mil/uL (ref 4.22–5.81)
RDW: 12.3 % (ref 11.5–15.5)
WBC: 4.9 10*3/uL (ref 4.0–10.5)

## 2019-01-05 LAB — HEMOGLOBIN A1C: Hgb A1c MFr Bld: 6.3 % (ref 4.6–6.5)

## 2019-01-05 LAB — TSH: TSH: 1.04 u[IU]/mL (ref 0.35–4.50)

## 2019-01-05 LAB — PSA: PSA: 1.89 ng/mL (ref 0.10–4.00)

## 2019-01-05 LAB — MICROALBUMIN / CREATININE URINE RATIO
Creatinine,U: 129.1 mg/dL
Microalb Creat Ratio: 0.5 mg/g (ref 0.0–30.0)
Microalb, Ur: <0.7 mg/dL (ref 0.0–1.9)

## 2019-01-05 MED ORDER — METFORMIN HCL ER 500 MG PO TB24: 1000.0000 mg | ORAL_TABLET | Freq: Every day | ORAL | 3 refills | Status: DC

## 2019-01-05 NOTE — Patient Instructions (Addendum)
You had the Tdap tetanus shot today  Please continue all other medications as before, and refills have been done if requested.  Please have the pharmacy call with any other refills you may need.  Please continue your efforts at being more active, low cholesterol diet, and weight control.  You are otherwise up to date with prevention measures today.  Please keep your appointments with your specialists as you may have planned  Your blood work was done today  Please return in 6 months, or sooner if needed, with Lab testing done 3-5 days before

## 2019-01-05 NOTE — Telephone Encounter (Signed)
Copied from [place] [phone] [id number]. Topic: General - Inquiry >> Jan 04, 2019  4:10 PM Vernona Rieger wrote: Reason for CRM: pt is returning Jerome [name]'s call regarding his appt on 5/22

## 2019-01-05 NOTE — Telephone Encounter (Signed)
Called pt to reschedule today

## 2019-01-05 NOTE — Progress Notes (Signed)
Subjective:    Patient ID: Jerome [name], male    DOB: 07/12/1971, 48 y.o.   MRN: [id number]  HPI  Here for wellness and f/u;  Overall doing ok;  Pt denies Chest pain, worsening SOB, DOE, wheezing, orthopnea, PND, worsening LE edema, palpitations, dizziness or syncope.  Pt denies neurological change such as new headache, facial or extremity weakness.  Pt denies polydipsia, polyuria, or low sugar symptoms. Pt states overall good compliance with treatment and medications, good tolerability, and has been trying to follow appropriate diet.  Pt denies worsening depressive symptoms, suicidal ideation or panic. No fever, night sweats, wt loss, loss of appetite, or other constitutional symptoms.  Pt states good ability with ADL's, has low fall risk, home safety reviewed and adequate, no other significant changes in hearing or vision, and only occasionally active with exercise.  Due for eye exam soon, has appt.  Gained 15 lbs with less good diet, works in [place], lives with family here.  Cant go to GYM due to pandemic.  No new complaints   Wt Readings from Last 3 Encounters:  01/05/19 282 lb (127.9 kg)  07/08/18 266 lb (120.7 kg)  05/04/17 274 lb (124.3 kg)   Past Medical History:  Diagnosis Date  . ALLERGIC RHINITIS 04/07/2007  . ASTHMA 04/07/2007  . CONSTIPATION 10/29/2009  . Diabetes mellitus type II, non insulin dependent ([place]) 03/08/2011   Improved w/ weight loss, A1c followed by primary care  . GERD 04/07/2007  . HEMATOCHEZIA 10/29/2009  . HYPERTENSION 04/01/2007  . LOW BACK PAIN 04/07/2007  . Morbid obesity ([place]) 04/07/2007   Past Surgical History:  Procedure Laterality Date  . CHOLECYSTECTOMY  2014    reports that he has been smoking. He has been smoking about 0.10 packs per day. He has never used smokeless tobacco. He reports current alcohol use. He reports that he does not use drugs. family history includes Diabetes Mellitus II in his father; Prostate cancer in his father; Stroke in an other  family member; Throat cancer in his maternal uncle. No Known Allergies  Current Outpatient Medications on File Prior to Visit  Medication Sig Dispense Refill  . aspirin EC 81 MG tablet Take 1 tablet (81 mg total) by mouth daily. 90 tablet 3  . glucose blood (ACCU-CHEK AVIVA PLUS) test strip 1 each by Other route as needed for other. Test bloog sugar twice a day  E11.9 100 each 2   No current facility-administered medications on file prior to visit.    Review of Systems Constitutional: Negative for other unusual diaphoresis, sweats, appetite or weight changes HENT: Negative for other worsening hearing loss, ear pain, facial swelling, mouth sores or neck stiffness.   Eyes: Negative for other worsening pain, redness or other visual disturbance.  Respiratory: Negative for other stridor or swelling Cardiovascular: Negative for other palpitations or other chest pain  Gastrointestinal: Negative for worsening diarrhea or loose stools, blood in stool, distention or other pain Genitourinary: Negative for hematuria, flank pain or other change in urine volume.  Musculoskeletal: Negative for myalgias or other joint swelling.  Skin: Negative for other color change, or other wound or worsening drainage.  Neurological: Negative for other syncope or numbness. Hematological: Negative for other adenopathy or swelling Psychiatric/Behavioral: Negative for hallucinations, other worsening agitation, SI, self-injury, or new decreased concentration All other system neg per pt    Objective:   Physical Exam BP 132/84   Pulse 95   Temp 98.6 F (37 C) (Oral)   Ht 5'  8" (1.727 m)   Wt 282 lb (127.9 kg)   SpO2 100%   BMI 42.88 kg/m  VS noted,  Constitutional: Pt is oriented to person, place, and time. Appears well-developed and well-nourished, in no significant distress and comfortable Head: Normocephalic and atraumatic  Eyes: Conjunctivae and EOM are normal. Pupils are equal, round, and reactive to light  Right Ear: External ear normal without discharge Left Ear: External ear normal without discharge Nose: Nose without discharge or deformity Mouth/Throat: Oropharynx is without other ulcerations and moist  Neck: Normal range of motion. Neck supple. No JVD present. No tracheal deviation present or significant neck LA or mass Cardiovascular: Normal rate, regular rhythm, normal heart sounds and intact distal pulses.   Pulmonary/Chest: WOB normal and breath sounds without rales or wheezing  Abdominal: Soft. Bowel sounds are normal. NT. No HSM  Musculoskeletal: Normal range of motion. Exhibits no edema Lymphadenopathy: Has no other cervical adenopathy.  Neurological: Pt is alert and oriented to person, place, and time. Pt has normal reflexes. No cranial nerve deficit. Motor grossly intact, Gait intact Skin: Skin is warm and dry. No rash noted or new ulcerations Psychiatric:  Has normal mood and affect. Behavior is normal without agitation No new complaints Lab Results  Component Value Date   WBC 4.9 01/05/2019   HGB 14.9 01/05/2019   HCT 43.6 01/05/2019   PLT 241.0 01/05/2019   GLUCOSE 167 (H) 01/05/2019   CHOL 131 01/05/2019   TRIG 122.0 01/05/2019   HDL 45.60 01/05/2019   LDLCALC 61 01/05/2019   ALT 48 01/05/2019   AST 29 01/05/2019   NA 141 01/05/2019   K 4.2 01/05/2019   CL 107 01/05/2019   CREATININE 1.03 01/05/2019   BUN 12 01/05/2019   CO2 26 01/05/2019   TSH 1.04 01/05/2019   PSA 1.89 01/05/2019   HGBA1C 6.3 01/05/2019   MICROALBUR <0.7 01/05/2019       Assessment & Plan:

## 2019-01-06 ENCOUNTER — Ambulatory Visit: Payer: Self-pay | Admitting: Internal Medicine

## 2019-01-07 ENCOUNTER — Encounter: Payer: Self-pay | Admitting: Internal Medicine

## 2019-01-07 NOTE — Assessment & Plan Note (Signed)

## 2019-01-07 NOTE — Assessment & Plan Note (Signed)
stable overall by history and exam, recent data reviewed with pt, and pt to continue medical treatment as before,  to f/u any worsening symptoms or concerns  

## 2019-01-20 ENCOUNTER — Telehealth: Payer: Self-pay | Admitting: Internal Medicine

## 2019-01-20 MED ORDER — METFORMIN HCL 500 MG PO TABS: 500.0000 mg | ORAL_TABLET | Freq: Two times a day (BID) | ORAL | 3 refills | Status: DC

## 2019-01-20 NOTE — Telephone Encounter (Signed)
shirron to let pt know  Thanks for your inquiry about the concern related to a possible cancer causing contaminant to the metformin ER.   We will simply need to change your metformin ER to the regular metformin as there has been no concern about this with the regular metformin.  We will send a new prescription, and simply start this when you are able instead.

## 2019-01-20 NOTE — Telephone Encounter (Signed)
Called pt, LVM.   

## 2019-01-20 NOTE — Telephone Encounter (Signed)
Copied from [place] [phone]. Topic: General - Other >> Jan 20, 2019 11:20 AM Celene Kras A wrote: Reason for CRM: Pt called regarding medication, metformin, and is requesting to know if there is a cause for alarm. Please advise.

## 2019-07-18 ENCOUNTER — Encounter: Payer: Self-pay | Admitting: Internal Medicine

## 2019-07-20 ENCOUNTER — Other Ambulatory Visit (INDEPENDENT_AMBULATORY_CARE_PROVIDER_SITE_OTHER): Payer: BC Managed Care – PPO

## 2019-07-20 DIAGNOSIS — E119 Type 2 diabetes mellitus without complications: Secondary | ICD-10-CM | POA: Diagnosis not present

## 2019-07-20 DIAGNOSIS — Z20828 Contact with and (suspected) exposure to other viral communicable diseases: Secondary | ICD-10-CM | POA: Diagnosis not present

## 2019-07-20 LAB — LIPID PANEL
Cholesterol: 121 mg/dL (ref 0–200)
HDL: 38.7 mg/dL — ABNORMAL LOW (ref >39.00)
LDL Cholesterol: 57 mg/dL (ref 0–99)
NonHDL: 82.15
Total CHOL/HDL Ratio: 3
Triglycerides: 128 mg/dL (ref 0.0–149.0)
VLDL: 25.6 mg/dL (ref 0.0–40.0)

## 2019-07-20 LAB — HEPATIC FUNCTION PANEL
ALT: 65 U/L — ABNORMAL HIGH (ref 0–53)
AST: 37 U/L (ref 0–37)
Albumin: 3.9 g/dL (ref 3.5–5.2)
Alkaline Phosphatase: 71 U/L (ref 39–117)
Bilirubin, Direct: 0.1 mg/dL (ref 0.0–0.3)
Total Bilirubin: 0.6 mg/dL (ref 0.2–1.2)
Total Protein: 6.7 g/dL (ref 6.0–8.3)

## 2019-07-20 LAB — BASIC METABOLIC PANEL
BUN: 6 mg/dL (ref 6–23)
CO2: 26 mEq/L (ref 19–32)
Calcium: 9 mg/dL (ref 8.4–10.5)
Chloride: 107 mEq/L (ref 96–112)
Creatinine, Ser: 0.96 mg/dL (ref 0.40–1.50)
GFR: 100.63 mL/min (ref >60.00)
Glucose, Bld: 226 mg/dL — ABNORMAL HIGH (ref 70–99)
Potassium: 3.7 mEq/L (ref 3.5–5.1)
Sodium: 141 mEq/L (ref 135–145)

## 2019-07-20 LAB — HEMOGLOBIN A1C: Hgb A1c MFr Bld: 7.7 % — ABNORMAL HIGH (ref 4.6–6.5)

## 2019-07-21 ENCOUNTER — Encounter: Payer: Self-pay | Admitting: Internal Medicine

## 2019-07-21 ENCOUNTER — Ambulatory Visit (INDEPENDENT_AMBULATORY_CARE_PROVIDER_SITE_OTHER): Payer: BC Managed Care – PPO | Admitting: Internal Medicine

## 2019-07-21 ENCOUNTER — Other Ambulatory Visit: Payer: Self-pay

## 2019-07-21 VITALS — BP 122/82 | HR 80 | Temp 98.1°F | Ht 68.0 in | Wt 280.0 lb

## 2019-07-21 DIAGNOSIS — E611 Iron deficiency: Secondary | ICD-10-CM

## 2019-07-21 DIAGNOSIS — E538 Deficiency of other specified B group vitamins: Secondary | ICD-10-CM | POA: Diagnosis not present

## 2019-07-21 DIAGNOSIS — E119 Type 2 diabetes mellitus without complications: Secondary | ICD-10-CM

## 2019-07-21 DIAGNOSIS — E559 Vitamin D deficiency, unspecified: Secondary | ICD-10-CM | POA: Diagnosis not present

## 2019-07-21 DIAGNOSIS — J45909 Unspecified asthma, uncomplicated: Secondary | ICD-10-CM

## 2019-07-21 DIAGNOSIS — Z0000 Encounter for general adult medical examination without abnormal findings: Secondary | ICD-10-CM

## 2019-07-21 DIAGNOSIS — I10 Essential (primary) hypertension: Secondary | ICD-10-CM

## 2019-07-21 NOTE — Patient Instructions (Signed)
Please continue all other medications as before, and refills have been done if requested.  Please have the pharmacy call with any other refills you may need.  Please continue your efforts at being more active, low cholesterol diet, and weight control..  Please keep your appointments with your specialists as you may have planned  Please return in 6 months, or sooner if needed, with Lab testing done 3-5 days before  

## 2019-07-21 NOTE — Progress Notes (Signed)
Subjective:    Patient ID: Jerome [name], male    DOB: 02-25-1971, 48 y.o.   MRN: [id number]  HPI  Here to f/u; overall doing ok,  Pt denies chest pain, increasing sob or doe, wheezing, orthopnea, PND, increased LE swelling, palpitations, dizziness or syncope.  Pt denies new neurological symptoms such as new headache, or facial or extremity weakness or numbness.  Pt denies polydipsia, polyuria, or low sugar episode.  Pt states overall good compliance with meds, mostly trying to follow appropriate diet, with wt overall stable,  but little exercise however. Gained wt in the pandemic.   Wt Readings from Last 3 Encounters:  07/21/19 280 lb (127 kg)  01/05/19 282 lb (127.9 kg)  07/08/18 266 lb (120.7 kg)   Past Medical History:  Diagnosis Date  . ALLERGIC RHINITIS 04/07/2007  . ASTHMA 04/07/2007  . CONSTIPATION 10/29/2009  . Diabetes mellitus type II, non insulin dependent ([place]) 03/08/2011   Improved w/ weight loss, A1c followed by primary care  . GERD 04/07/2007  . HEMATOCHEZIA 10/29/2009  . HYPERTENSION 04/01/2007  . LOW BACK PAIN 04/07/2007  . Morbid obesity ([place]) 04/07/2007   Past Surgical History:  Procedure Laterality Date  . CHOLECYSTECTOMY  2014    reports that he has been smoking. He has been smoking about 0.10 packs per day. He has never used smokeless tobacco. He reports current alcohol use. He reports that he does not use drugs. family history includes Diabetes Mellitus II in his father; Prostate cancer in his father; Stroke in an other family member; Throat cancer in his maternal uncle. No Known Allergies Current Outpatient Medications on File Prior to Visit  Medication Sig Dispense Refill  . aspirin EC 81 MG tablet Take 1 tablet (81 mg total) by mouth daily. 90 tablet 3  . glucose blood (ACCU-CHEK AVIVA PLUS) test strip 1 each by Other route as needed for other. Test bloog sugar twice a day  E11.9 100 each 2  . metFORMIN (GLUCOPHAGE) 500 MG tablet Take 1 tablet (500 mg total) by  mouth 2 (two) times daily with a meal. 180 tablet 3   No current facility-administered medications on file prior to visit.    Review of Systems  Constitutional: Negative for other unusual diaphoresis or sweats HENT: Negative for ear discharge or swelling Eyes: Negative for other worsening visual disturbances Respiratory: Negative for stridor or other swelling  Gastrointestinal: Negative for worsening distension or other blood Genitourinary: Negative for retention or other urinary change Musculoskeletal: Negative for other MSK pain or swelling Skin: Negative for color change or other new lesions Neurological: Negative for worsening tremors and other numbness  Psychiatric/Behavioral: Negative for worsening agitation or other fatigue All otherwise neg per pt    Objective:   Physical Exam BP 122/82   Pulse 80   Temp 98.1 F (36.7 C) (Oral)   Ht 5\' 8"  (1.727 m)   Wt 280 lb (127 kg)   SpO2 97%   BMI 42.57 kg/m  VS noted,  Constitutional: Pt appears in NAD HENT: Head: NCAT.  Right Ear: External ear normal.  Left Ear: External ear normal.  Eyes: . Pupils are equal, round, and reactive to light. Conjunctivae and EOM are normal Nose: without d/c or deformity Neck: Neck supple. Gross normal ROM Cardiovascular: Normal rate and regular rhythm.   Pulmonary/Chest: Effort normal and breath sounds without rales or wheezing.  Neurological: Pt is alert. At baseline orientation, motor grossly intact Skin: Skin is warm. No rashes, other new lesions,  no LE edema Psychiatric: Pt behavior is normal without agitation  All otherwise neg per pt  Lab Results  Component Value Date   WBC 4.9 01/05/2019   HGB 14.9 01/05/2019   HCT 43.6 01/05/2019   PLT 241.0 01/05/2019   GLUCOSE 226 (H) 07/20/2019   CHOL 121 07/20/2019   TRIG 128.0 07/20/2019   HDL 38.70 (L) 07/20/2019   LDLCALC 57 07/20/2019   ALT 65 (H) 07/20/2019   AST 37 07/20/2019   NA 141 07/20/2019   K 3.7 07/20/2019   CL 107  07/20/2019   CREATININE 0.96 07/20/2019   BUN 6 07/20/2019   CO2 26 07/20/2019   TSH 1.04 01/05/2019   PSA 1.89 01/05/2019   HGBA1C 7.7 (H) 07/20/2019   MICROALBUR <0.7 01/05/2019      Assessment & Plan:

## 2019-07-21 NOTE — Assessment & Plan Note (Signed)
Uncontrolled with wt gain due to less ACTIVE with pandemic, but plans to turn this around, declines med change

## 2019-07-22 ENCOUNTER — Encounter: Payer: Self-pay | Admitting: Internal Medicine

## 2019-07-22 NOTE — Assessment & Plan Note (Signed)
stable overall by history and exam, recent data reviewed with pt, and pt to continue medical treatment as before,  to f/u any worsening symptoms or concerns  

## 2020-01-19 ENCOUNTER — Ambulatory Visit: Payer: BC Managed Care – PPO | Admitting: Internal Medicine

## 2020-01-29 ENCOUNTER — Other Ambulatory Visit: Payer: Self-pay | Admitting: Internal Medicine

## 2020-01-29 NOTE — Telephone Encounter (Signed)
1.Medication Requested:metFORMIN (GLUCOPHAGE) 500 MG tablet  2. Pharmacy (Name, [facility], [place], [place] - [place] [address] [place]  3. On Med List: Yes   4. Last Visit with PCP: 12.4.20   5. Next visit date with PCP: n/a   Agent: Please be advised that RX refills may take up to 3 business days. We ask that you follow-up with your pharmacy.

## 2020-01-31 ENCOUNTER — Other Ambulatory Visit: Payer: Self-pay | Admitting: Internal Medicine

## 2020-01-31 NOTE — Telephone Encounter (Signed)
Please refill as per office routine med refill policy (all routine meds refilled for 3 mo or monthly per pt preference up to one year from last visit, then month to month grace period for 3 mo, then further med refills will have to be denied)  

## 2020-02-01 ENCOUNTER — Telehealth: Payer: Self-pay | Admitting: Internal Medicine

## 2020-02-01 NOTE — Telephone Encounter (Signed)
Error no note needed °

## 2020-02-05 ENCOUNTER — Other Ambulatory Visit: Payer: Self-pay | Admitting: Internal Medicine

## 2020-02-05 MED ORDER — METFORMIN HCL ER 500 MG PO TB24: 1000.0000 mg | ORAL_TABLET | Freq: Every day | ORAL | 5 refills | Status: DC

## 2020-03-08 ENCOUNTER — Telehealth: Payer: Self-pay

## 2020-03-08 NOTE — Telephone Encounter (Signed)
New message    1.Medication Requested:metFORMIN (GLUCOPHAGE-XR) 500 MG 24 hr tablet  2. Pharmacy (Name, [place], [place], [place] - [address] AT [place]  3. On Med List: Yes   4. Last Visit with PCP: 12.4.20  5. Next visit date with PCP: 7.27.20    Agent: Please be advised that RX refills may take up to 3 business days. We ask that you follow-up with your pharmacy.

## 2020-03-11 ENCOUNTER — Other Ambulatory Visit: Payer: Self-pay

## 2020-03-11 MED ORDER — METFORMIN HCL ER 500 MG PO TB24: 1000.0000 mg | ORAL_TABLET | Freq: Every day | ORAL | 5 refills | Status: DC

## 2020-03-11 NOTE — Telephone Encounter (Signed)
Sent to Pharmacy. 

## 2020-03-12 ENCOUNTER — Ambulatory Visit: Payer: BC Managed Care – PPO | Admitting: Internal Medicine

## 2020-03-12 ENCOUNTER — Encounter: Payer: Self-pay | Admitting: Internal Medicine

## 2020-03-12 ENCOUNTER — Other Ambulatory Visit: Payer: Self-pay

## 2020-03-12 VITALS — BP 140/80 | HR 90 | Temp 99.0°F | Ht 68.0 in | Wt 260.0 lb

## 2020-03-12 DIAGNOSIS — E538 Deficiency of other specified B group vitamins: Secondary | ICD-10-CM | POA: Diagnosis not present

## 2020-03-12 DIAGNOSIS — E119 Type 2 diabetes mellitus without complications: Secondary | ICD-10-CM

## 2020-03-12 DIAGNOSIS — E559 Vitamin D deficiency, unspecified: Secondary | ICD-10-CM | POA: Diagnosis not present

## 2020-03-12 DIAGNOSIS — Z0000 Encounter for general adult medical examination without abnormal findings: Secondary | ICD-10-CM

## 2020-03-12 DIAGNOSIS — Z1159 Encounter for screening for other viral diseases: Secondary | ICD-10-CM

## 2020-03-12 MED ORDER — ACCU-CHEK AVIVA PLUS VI STRP: ORAL_STRIP | 11 refills | Status: DC

## 2020-03-12 MED ORDER — METFORMIN HCL ER 500 MG PO TB24: 1000.0000 mg | ORAL_TABLET | Freq: Every day | ORAL | 3 refills | Status: DC

## 2020-03-12 MED ORDER — ACCU-CHEK AVIVA PLUS VI STRP: 1.0000 | ORAL_STRIP | 11 refills | Status: DC | PRN

## 2020-03-12 NOTE — Patient Instructions (Addendum)
Please continue all other medications as before, and refills have been done if requested.  Please have the pharmacy call with any other refills you may need.  Please continue your efforts at being more active, low cholesterol diet, and weight control.  You are otherwise up to date with prevention measures today.  Please keep your appointments with your specialists as you may have planned  You will be contacted regarding the referral for: colonoscopy  Please go to the LAB at the blood drawing area for the tests to be done later this wk  You will be contacted by phone if any changes need to be made immediately.  Otherwise, you will receive a letter about your results with an explanation, but please check with MyChart first.  Please remember to sign up for MyChart if you have not done so, as this will be important to you in the future with finding out test results, communicating by private email, and scheduling acute appointments online when needed.  Please make an Appointment to return in 6 months, or sooner if needed, also with Lab Appointment for testing done 3-5 days before at the [facility] [facility] (so this is for TWO appointments - please see the scheduling desk as you leave)

## 2020-03-12 NOTE — Progress Notes (Signed)
Subjective:    Patient ID: Jerome [name], male    DOB: 1970-11-07, 49 y.o.   MRN: [id number]  HPI  Here for wellness and f/u;  Overall doing ok;  Pt denies Chest pain, worsening SOB, DOE, wheezing, orthopnea, PND, worsening LE edema, palpitations, dizziness or syncope.  Pt denies neurological change such as new headache, facial or extremity weakness.  Pt denies polydipsia, polyuria, or low sugar symptoms. Pt states overall good compliance with treatment and medications, good tolerability, and has been trying to follow appropriate diet.  Pt denies worsening depressive symptoms, suicidal ideation or panic. No fever, night sweats, wt loss, loss of appetite, or other constitutional symptoms.  Pt states good ability with ADL's, has low fall risk, home safety reviewed and adequate, no other significant changes in hearing or vision, and only occasionally active with exercise.  No new complaints Past Medical History:  Diagnosis Date  . ALLERGIC RHINITIS 04/07/2007  . ASTHMA 04/07/2007  . CONSTIPATION 10/29/2009  . Diabetes mellitus type II, non insulin dependent ([place]) 03/08/2011   Improved w/ weight loss, A1c followed by primary care  . GERD 04/07/2007  . HEMATOCHEZIA 10/29/2009  . HYPERTENSION 04/01/2007  . LOW BACK PAIN 04/07/2007  . Morbid obesity ([place]) 04/07/2007   Past Surgical History:  Procedure Laterality Date  . CHOLECYSTECTOMY  2014    reports that he has been smoking. He has been smoking about 0.10 packs per day. He has never used smokeless tobacco. He reports current alcohol use. He reports that he does not use drugs. family history includes Diabetes Mellitus II in his father; Prostate cancer in his father; Stroke in an other family member; Throat cancer in his maternal uncle. No Known Allergies Current Outpatient Medications on File Prior to Visit  Medication Sig Dispense Refill  . aspirin EC 81 MG tablet Take 1 tablet (81 mg total) by mouth daily. 90 tablet 3   No current  facility-administered medications on file prior to visit.   Review of Systems All otherwise neg per pt     Objective:   Physical Exam BP (!) 140/80 (BP Location: Left Arm, Patient Position: Sitting, Cuff Size: Large)   Pulse 90   Temp 99 F (37.2 C) (Oral)   Ht 5\' 8"  (1.727 m)   Wt (!) 260 lb (117.9 kg)   SpO2 96%   BMI 39.53 kg/m  VS noted,  Constitutional: Pt appears in NAD HENT: Head: NCAT.  Right Ear: External ear normal.  Left Ear: External ear normal.  Eyes: . Pupils are equal, round, and reactive to light. Conjunctivae and EOM are normal Nose: without d/c or deformity Neck: Neck supple. Gross normal ROM Cardiovascular: Normal rate and regular rhythm.   Pulmonary/Chest: Effort normal and breath sounds without rales or wheezing.  Abd:  Soft, NT, ND, + BS, no organomegaly Neurological: Pt is alert. At baseline orientation, motor grossly intact Skin: Skin is warm. No rashes, other new lesions, no LE edema Psychiatric: Pt behavior is normal without agitation  All otherwise neg per pt Lab Results  Component Value Date   WBC 4.9 01/05/2019   HGB 14.9 01/05/2019   HCT 43.6 01/05/2019   PLT 241.0 01/05/2019   GLUCOSE 226 (H) 07/20/2019   CHOL 121 07/20/2019   TRIG 128.0 07/20/2019   HDL 38.70 (L) 07/20/2019   LDLCALC 57 07/20/2019   ALT 65 (H) 07/20/2019   AST 37 07/20/2019   NA 141 07/20/2019   K 3.7 07/20/2019   CL 107 07/20/2019  CREATININE 0.96 07/20/2019   BUN 6 07/20/2019   CO2 26 07/20/2019   TSH 1.04 01/05/2019   PSA 1.89 01/05/2019   HGBA1C 7.7 (H) 07/20/2019   MICROALBUR <0.7 01/05/2019         Assessment & Plan:

## 2020-03-17 ENCOUNTER — Encounter: Payer: Self-pay | Admitting: Internal Medicine

## 2020-03-17 NOTE — Assessment & Plan Note (Signed)

## 2020-03-17 NOTE — Assessment & Plan Note (Signed)
stable overall by history and exam, recent data reviewed with pt, and pt to continue medical treatment as before,  to f/u any worsening symptoms or concerns  

## 2020-03-22 ENCOUNTER — Other Ambulatory Visit: Payer: BC Managed Care – PPO

## 2020-03-22 DIAGNOSIS — Z1159 Encounter for screening for other viral diseases: Secondary | ICD-10-CM

## 2020-03-22 DIAGNOSIS — E559 Vitamin D deficiency, unspecified: Secondary | ICD-10-CM

## 2020-03-22 DIAGNOSIS — Z0000 Encounter for general adult medical examination without abnormal findings: Secondary | ICD-10-CM

## 2020-03-22 DIAGNOSIS — E538 Deficiency of other specified B group vitamins: Secondary | ICD-10-CM | POA: Diagnosis not present

## 2020-03-22 DIAGNOSIS — E119 Type 2 diabetes mellitus without complications: Secondary | ICD-10-CM

## 2020-03-23 LAB — URINALYSIS, ROUTINE W REFLEX MICROSCOPIC
Bilirubin Urine: NEGATIVE
Glucose, UA: NEGATIVE
Hgb urine dipstick: NEGATIVE
Ketones, ur: NEGATIVE
Leukocytes,Ua: NEGATIVE
Nitrite: NEGATIVE
Protein, ur: NEGATIVE
Specific Gravity, Urine: 1.017 (ref 1.001–1.03)
pH: 5.5 (ref 5.0–8.0)

## 2020-03-23 LAB — CBC WITH DIFFERENTIAL/PLATELET
Absolute Monocytes: 504 cells/uL (ref 200–950)
Basophils Absolute: 31 cells/uL (ref 0–200)
Basophils Relative: 0.6 %
Eosinophils Absolute: 140 cells/uL (ref 15–500)
Eosinophils Relative: 2.7 %
HCT: 46.4 % (ref 38.5–50.0)
Hemoglobin: 15.1 g/dL (ref 13.2–17.1)
Lymphs Abs: 1992 cells/uL (ref 850–3900)
MCH: 28.8 pg (ref 27.0–33.0)
MCHC: 32.5 g/dL (ref 32.0–36.0)
MCV: 88.4 fL (ref 80.0–100.0)
MPV: 10.4 fL (ref 7.5–12.5)
Monocytes Relative: 9.7 %
Neutro Abs: 2532 cells/uL (ref 1500–7800)
Neutrophils Relative %: 48.7 %
Platelets: 268 10*3/uL (ref 140–400)
RBC: 5.25 10*6/uL (ref 4.20–5.80)
RDW: 11.5 % (ref 11.0–15.0)
Total Lymphocyte: 38.3 %
WBC: 5.2 10*3/uL (ref 3.8–10.8)

## 2020-03-23 LAB — TSH: TSH: 1.98 mIU/L (ref 0.40–4.50)

## 2020-03-23 LAB — LIPID PANEL
Cholesterol: 128 mg/dL (ref <200)
HDL: 43 mg/dL (ref >=40)
LDL Cholesterol (Calc): 67 mg/dL (calc)
Non-HDL Cholesterol (Calc): 85 mg/dL (calc) (ref <130)
Total CHOL/HDL Ratio: 3 (calc) (ref <5.0)
Triglycerides: 99 mg/dL (ref <150)

## 2020-03-23 LAB — VITAMIN D 25 HYDROXY (VIT D DEFICIENCY, FRACTURES): Vit D, 25-Hydroxy: 8 ng/mL — ABNORMAL LOW (ref 30–100)

## 2020-03-23 LAB — VITAMIN B12: Vitamin B-12: 384 pg/mL (ref 200–1100)

## 2020-03-23 LAB — HEMOGLOBIN A1C
Hgb A1c MFr Bld: 9.6 % of total Hgb — ABNORMAL HIGH (ref <5.7)
Mean Plasma Glucose: 229 (calc)
eAG (mmol/L): 12.7 (calc)

## 2020-03-23 LAB — MICROALBUMIN / CREATININE URINE RATIO
Creatinine, Urine: 180 mg/dL (ref 20–320)
Microalb Creat Ratio: 4 mcg/mg creat (ref <30)
Microalb, Ur: 0.8 mg/dL

## 2020-03-23 LAB — PSA: PSA: 1.1 ng/mL (ref <=4.0)

## 2020-03-25 LAB — COMPLETE METABOLIC PANEL WITH GFR
AG Ratio: 1.5 (calc) (ref 1.0–2.5)
ALT: 34 U/L (ref 9–46)
AST: 26 U/L (ref 10–40)
Albumin: 4 g/dL (ref 3.6–5.1)
Alkaline phosphatase (APISO): 63 U/L (ref 36–130)
BUN: 8 mg/dL (ref 7–25)
CO2: 23 mmol/L (ref 20–32)
Calcium: 9.1 mg/dL (ref 8.6–10.3)
Chloride: 105 mmol/L (ref 98–110)
Creat: 0.81 mg/dL (ref 0.60–1.35)
GFR, Est African American: 121 mL/min/{1.73_m2} (ref >=60)
GFR, Est Non African American: 104 mL/min/{1.73_m2} (ref >=60)
Globulin: 2.7 g/dL (calc) (ref 1.9–3.7)
Glucose, Bld: 194 mg/dL — ABNORMAL HIGH (ref 65–99)
Potassium: 3.7 mmol/L (ref 3.5–5.3)
Sodium: 140 mmol/L (ref 135–146)
Total Bilirubin: 1 mg/dL (ref 0.2–1.2)
Total Protein: 6.7 g/dL (ref 6.1–8.1)

## 2020-03-25 LAB — HEPATITIS C ANTIBODY
Hepatitis C Ab: NONREACTIVE
SIGNAL TO CUT-OFF: 0.11 (ref <1.00)

## 2020-03-28 ENCOUNTER — Other Ambulatory Visit: Payer: Self-pay | Admitting: Internal Medicine

## 2020-03-28 ENCOUNTER — Encounter: Payer: Self-pay | Admitting: Internal Medicine

## 2020-03-28 MED ORDER — METFORMIN HCL ER 500 MG PO TB24: 2000.0000 mg | ORAL_TABLET | Freq: Every day | ORAL | 3 refills | Status: DC

## 2020-04-11 ENCOUNTER — Telehealth: Payer: Self-pay

## 2020-04-11 NOTE — Telephone Encounter (Signed)
New message   The patient needs a little more clarity on the voicemail that was left regarding labs results.    Asking for a callback

## 2020-04-12 NOTE — Telephone Encounter (Signed)
Lvm for pt to call the clinic back for clarity of his labs.

## 2020-04-12 NOTE — Telephone Encounter (Signed)
Pt returned your call. Please give him a call back thanks

## 2020-04-12 NOTE — Telephone Encounter (Signed)
Tried calling pt. Pt phone rang out. Was not able to lvm.

## 2020-04-25 ENCOUNTER — Other Ambulatory Visit: Payer: Self-pay

## 2020-04-25 ENCOUNTER — Encounter: Payer: Self-pay | Admitting: Family Medicine

## 2020-04-25 ENCOUNTER — Ambulatory Visit (INDEPENDENT_AMBULATORY_CARE_PROVIDER_SITE_OTHER): Payer: BC Managed Care – PPO | Admitting: Family Medicine

## 2020-04-25 ENCOUNTER — Ambulatory Visit: Payer: Self-pay

## 2020-04-25 VITALS — BP 140/90 | HR 78 | Ht 68.0 in | Wt 260.0 lb

## 2020-04-25 DIAGNOSIS — M25551 Pain in right hip: Secondary | ICD-10-CM | POA: Diagnosis not present

## 2020-04-25 DIAGNOSIS — M7061 Trochanteric bursitis, right hip: Secondary | ICD-10-CM | POA: Diagnosis not present

## 2020-04-25 MED ORDER — GABAPENTIN 300 MG PO CAPS: 300.0000 mg | ORAL_CAPSULE | Freq: Three times a day (TID) | ORAL | 3 refills | Status: DC | PRN

## 2020-04-25 NOTE — Progress Notes (Signed)
    Subjective:    CC: hip/thigh pain  I, Judy Pimple, am serving as a scribe for Dr. Lynne Leader.  HPI: Pt is a 49 y/o male presenting w/ c/o hip/thigh pain locates pain to below buttock/hip groin area . States pain is a 1 today has not had to take any medication today. His job does require going up and down stair a few times a day but other than that not sure what the cause would of been.   He does not occasionally have some pain radiating below the level of his knee however most the time that has been diminished recently.  He lives and works most of the time in [place] which is in the [facility].  Radiating pain: down the R thigh area  Aggravating factors: sitting, driving   Treatments tried: heating pad, aleve, ibuprofen   Pertinent review of Systems: No fevers or chills  Relevant historical information: Diabetes   Objective:    Vitals:   04/25/20 1206  BP: 140/90  Pulse: 78  SpO2: 98%   General: Well Developed, well nourished, and in no acute distress.   MSK: Right hip normal-appearing normal motion.  Tender palpation greater trochanter. Hip abduction strength diminished 4/5.  External rotation strength diminished 3+/5 with pain. Not particularly tender over piriformis not able to reproduce radicular pain. Lower extremity strength intact otherwise.  Sensation intact otherwise.  Lab and Radiology Results Lab Results  Component Value Date   HGBA1C 9.6 (H) 03/22/2020     Impression and Recommendations:    Assessment and Plan: 49 y.o. male with right lateral hip pain.  Trochanteric bursitis with probable piriformis syndrome as well.  Plan to treat with physical therapy.  Will prescribe gabapentin for occasional radicular pain that may be problematic however I do not expect that he will need to take very much of it.  Of note he lives in [place] most the time so we will order physical therapy for that location.  Recheck back as needed.  May  consider virtual visit if needed as well.   Orders Placed This Encounter  Procedures  . Ambulatory referral to Physical Therapy    Referral Priority:   Routine    Referral Type:   Physical Medicine    Referral Reason:   Specialty Services Required    Requested Specialty:   Physical Therapy    Number of Visits Requested:   1   Meds ordered this encounter  Medications  . gabapentin (NEURONTIN) 300 MG capsule    Sig: Take 1 capsule (300 mg total) by mouth 3 (three) times daily as needed (nerve pain).    Dispense:  90 capsule    Refill:  3    Discussed warning signs or symptoms. Please see discharge instructions. Patient expresses understanding.   The above documentation has been reviewed and is accurate and complete Lynne Leader, M.D.

## 2020-04-25 NOTE — Patient Instructions (Signed)
Thank you for coming in today. Plan for PT in [place].  Recheck if not better in 6 weeks.  Let me know if this is not working.  Use the gabapentin as needed for nerve pain.  Can do a shot if needed.    Hip Bursitis  Hip bursitis is swelling of a fluid-filled sac (bursa) in your hip joint. This swelling (inflammation) can be painful. This condition may come and go over time. What are the causes?  Injury to the hip.  Overuse of the muscles that surround the hip joint.  An earlier injury or surgery of the hip.  Arthritis or gout.  Diabetes.  Thyroid disease.  Infection.  In some cases, the cause may not be known. What are the signs or symptoms?  Mild or moderate pain in the hip area. Pain may get worse with movement.  Tenderness and swelling of the hip, especially on the outer side of the hip.  In rare cases, the bursa may become infected. This may cause: ? A fever. ? Warmth and redness in the area. Symptoms may come and go. How is this treated? This condition is treated by resting, icing, applying pressure (compression), and raising (elevating) the injured area. You may hear this called the RICE treatment. Treatment may also include:  Using crutches.  Draining fluid out of the bursa to help relieve swelling.  Giving a shot of (injecting) medicine that helps to reduce swelling (cortisone).  Other medicines if the bursa is infected. Follow these instructions at home: Managing pain, stiffness, and swelling   If told, put ice on the painful area. ? Put ice in a plastic bag. ? Place a towel between your skin and the bag. ? Leave the ice on for 20 minutes, 2-3 times a day. ? Raise (elevate) your hip above the level of your heart as much as you can without pain. To do this, try putting a pillow under your hips while you lie down. Stop if this causes pain. Activity  Return to your normal activities as told by your doctor. Ask your doctor what activities are safe for  you.  Rest and protect your hip as much as you can until you feel better. General instructions  Take over-the-counter and prescription medicines only as told by your doctor.  Wear wraps that put pressure on your hip (compression wraps) only as told by your doctor.  Do not use your hip to support your body weight until your doctor says that you can.  Use crutches as told by your doctor.  Gently rub and stretch your injured area as often as is comfortable.  Keep all follow-up visits as told by your doctor. This is important. How is this prevented?  Exercise regularly, as told by your doctor.  Warm up and stretch before being active.  Cool down and stretch after being active.  Avoid activities that bother your hip or cause pain.  Avoid sitting down for long periods at a time. Contact a doctor if:  You have a fever.  You get new symptoms.  You have trouble walking.  You have trouble doing everyday activities.  You have pain that gets worse.  You have pain that does not get better with medicine.  You get red skin on your hip area.  You get a feeling of warmth in your hip area. Get help right away if:  You cannot move your hip.  You have very bad pain. Summary  Hip bursitis is swelling of a fluid-filled  sac (bursa) in your hip.  Hip bursitis can be painful.  Symptoms often come and go over time.  This condition is treated with rest, ice, compression, elevation, and medicines. This information is not intended to replace advice given to you by your health care provider. Make sure you discuss any questions you have with your health care provider. Document Revised: 04/11/2018 Document Reviewed: 04/11/2018 Elsevier Patient Education  [place].   Piriformis Syndrome Rehab Ask your health care provider which exercises are safe for you. Do exercises exactly as told by your health care provider and adjust them as directed. It is normal to feel mild  stretching, pulling, tightness, or discomfort as you do these exercises. Stop right away if you feel sudden pain or your pain gets worse. Do not begin these exercises until told by your health care provider. Stretching and range-of-motion exercises These exercises warm up your muscles and joints and improve the movement and flexibility of your hip and pelvis. The exercises also help to relieve pain, numbness, and tingling. Hip rotation This is an exercise in which you lie on your back and stretch the muscles that rotate your hip (hip rotators) to stretch your buttocks. 1. Lie on your back on a firm surface. 2. Pull your left / right knee toward your same shoulder with your left / right hand until your knee is pointing toward the ceiling. Hold your left / right ankle with your other hand. 3. Keeping your knee steady, gently pull your left / right ankle toward your other shoulder until you feel a stretch in your buttocks. 4. Hold this position for __________ seconds. Repeat __________ times. Complete this exercise __________ times a day. Hip extensor This is an exercise in which you lie on your back and pull your knee to your chest. 1. Lie on your back on a firm surface. Both of your legs should be straight. 2. Pull your left / right knee to your chest. Hold your leg in this position by holding onto the back of your thigh or the front of your knee. 3. Hold this position for __________ seconds. 4. Slowly return to the starting position. Repeat __________ times. Complete this exercise __________ times a day. Strengthening exercises These exercises build strength and endurance in your hip and thigh muscles. Endurance is the ability to use your muscles for a long time, even after they get tired. Straight leg raises, side-lying This exercise strengthens the muscles that rotate the leg at the hip and move it away from your body (hip abductors). 1. Lie on your side with your left / right leg in the top  position. Lie so your head, shoulder, knee, and hip line up. Bend your bottom knee to help you balance. 2. Lift your top leg 4-6 inches (10-15 cm) while keeping your toes pointed straight ahead. 3. Hold this position for __________ seconds. 4. Slowly lower your leg to the starting position. 5. Let your muscles relax completely after each repetition. Repeat __________ times. Complete this exercise __________ times a day. Hip abduction and rotation This is sometimes called quadruped (on hands and knees) exercises. 1. Get on your hands and knees on a firm, lightly padded surface. Your hands should be directly below your shoulders, and your knees should be directly below your hips. 2. Lift your left / right knee out to the side. Keep your knee bent. Do not twist your body. 3. Hold this position for __________ seconds. 4. Slowly lower your leg. Repeat __________  times. Complete this exercise __________ times a day. Straight leg raises, face-down This exercise stretches the muscles that move your hips away from the front of the pelvis (hip extensors). 1. Lie on your abdomen on a bed or a firm surface with a pillow under your hips. 2. Squeeze your buttocks muscles and lift your left / right leg about 4-6 inches (10-15 cm) off the bed. Do not let your back arch. 3. Hold this position for __________ seconds. 4. Slowly lower your leg to the starting position. 5. Let your muscles relax completely after each repetition. Repeat __________ times. Complete this exercise __________ times a day. This information is not intended to replace advice given to you by your health care provider. Make sure you discuss any questions you have with your health care provider. Document Revised: 11/24/2018 Document Reviewed: 05/26/2018 Elsevier Patient Education  [place].

## 2020-06-28 ENCOUNTER — Encounter: Payer: Self-pay | Admitting: Family

## 2020-06-28 ENCOUNTER — Ambulatory Visit: Payer: BC Managed Care – PPO | Admitting: Family

## 2020-06-28 ENCOUNTER — Other Ambulatory Visit: Payer: Self-pay

## 2020-06-28 VITALS — BP 160/90 | HR 71 | Temp 98.3°F | Ht 68.0 in | Wt 260.8 lb

## 2020-06-28 DIAGNOSIS — E119 Type 2 diabetes mellitus without complications: Secondary | ICD-10-CM

## 2020-06-28 DIAGNOSIS — I10 Essential (primary) hypertension: Secondary | ICD-10-CM

## 2020-06-28 DIAGNOSIS — E538 Deficiency of other specified B group vitamins: Secondary | ICD-10-CM | POA: Diagnosis not present

## 2020-06-28 LAB — VITAMIN B12: Vitamin B-12: 230 pg/mL (ref 211–911)

## 2020-06-28 LAB — CBC
HCT: 42.6 % (ref 39.0–52.0)
Hemoglobin: 14.3 g/dL (ref 13.0–17.0)
MCHC: 33.5 g/dL (ref 30.0–36.0)
MCV: 86.5 fl (ref 78.0–100.0)
Platelets: 264 10*3/uL (ref 150.0–400.0)
RBC: 4.92 Mil/uL (ref 4.22–5.81)
RDW: 12.1 % (ref 11.5–15.5)
WBC: 6.3 10*3/uL (ref 4.0–10.5)

## 2020-06-28 LAB — COMPREHENSIVE METABOLIC PANEL
ALT: 24 U/L (ref 0–53)
AST: 18 U/L (ref 0–37)
Albumin: 4.2 g/dL (ref 3.5–5.2)
Alkaline Phosphatase: 57 U/L (ref 39–117)
BUN: 16 mg/dL (ref 6–23)
CO2: 29 mEq/L (ref 19–32)
Calcium: 9.1 mg/dL (ref 8.4–10.5)
Chloride: 105 mEq/L (ref 96–112)
Creatinine, Ser: 1 mg/dL (ref 0.40–1.50)
GFR: 87.96 mL/min (ref >60.00)
Glucose, Bld: 133 mg/dL — ABNORMAL HIGH (ref 70–99)
Potassium: 3.8 mEq/L (ref 3.5–5.1)
Sodium: 141 mEq/L (ref 135–145)
Total Bilirubin: 0.8 mg/dL (ref 0.2–1.2)
Total Protein: 7.2 g/dL (ref 6.0–8.3)

## 2020-06-28 MED ORDER — LISINOPRIL 10 MG PO TABS: 10.0000 mg | ORAL_TABLET | Freq: Every day | ORAL | 1 refills | Status: DC

## 2020-06-28 NOTE — Progress Notes (Signed)
Jerome [name] is a 49 y.o. male with the following history as recorded in [employer]:  Patient Active Problem List   Diagnosis Date Noted  . Cervical radiculopathy 07/23/2016  . Thoracic back pain 07/23/2016  . Chest pain 07/21/2016  . Cough 10/25/2014  . Increased prostate specific antigen (PSA) velocity 04/26/2014  . Loss of weight 10/24/2012  . Urinary frequency 10/24/2012  . Edema 03/17/2011  . Diabetes ([place]) 03/08/2011  . Preventative health care 03/08/2011  . CONSTIPATION 10/29/2009  . HEMATOCHEZIA 10/29/2009  . Morbid obesity ([place]) 04/07/2007  . ALLERGIC RHINITIS 04/07/2007  . Asthma 04/07/2007  . GERD 04/07/2007  . Lumbar back pain 04/07/2007  . Essential hypertension 04/01/2007    Current Outpatient Medications  Medication Sig Dispense Refill  . aspirin EC 81 MG tablet Take 1 tablet (81 mg total) by mouth daily. 90 tablet 3  . gabapentin (NEURONTIN) 300 MG capsule Take 1 capsule (300 mg total) by mouth 3 (three) times daily as needed (nerve pain). 90 capsule 3  . glucose blood (ACCU-CHEK AVIVA PLUS) test strip Test blood sugar twice a day E11.9 200 each 11  . metFORMIN (GLUCOPHAGE-XR) 500 MG 24 hr tablet Take 4 tablets (2,000 mg total) by mouth daily with breakfast. 360 tablet 3  . lisinopril (ZESTRIL) 10 MG tablet Take 1 tablet (10 mg total) by mouth daily. 90 tablet 1   No current facility-administered medications for this visit.    Allergies: Patient has no known allergies.  Past Medical History:  Diagnosis Date  . ALLERGIC RHINITIS 04/07/2007  . ASTHMA 04/07/2007  . CONSTIPATION 10/29/2009  . Diabetes mellitus type II, non insulin dependent ([place]) 03/08/2011   Improved w/ weight loss, A1c followed by primary care  . GERD 04/07/2007  . HEMATOCHEZIA 10/29/2009  . HYPERTENSION 04/01/2007  . LOW BACK PAIN 04/07/2007  . Morbid obesity ([place]) 04/07/2007    Past Surgical History:  Procedure Laterality Date  . CHOLECYSTECTOMY  2014    Family History  Problem Relation  Age of Onset  . Stroke Other   . Diabetes Mellitus II Father   . Prostate cancer Father   . Throat cancer Maternal Uncle   . CAD Neg Hx     Social History   Tobacco Use  . Smoking status: Former Smoker    Packs/day: 0.10  . Smokeless tobacco: Never Used  Substance Use Topics  . Alcohol use: Yes    Subjective:  Patient has been having increased problems with numbness and tingling for the past 2 weeks; was concerned that symptoms were related to high dose of Metformin- he stopped on his own and has had no further symptoms; notes his sugars have been "stable" the past few days without medication; glucose this am was 117; Denies any chest pain, shortness of breath, blurred vision or headache Is actively working on getting his weight down; does not want any more diabetes medications than he absolutely has to take;  Denies any chest pain, shortness of breath, blurred vision or headache.       Objective:  Vitals:   06/28/20 1342  BP: (!) 160/90  Pulse: 71  Temp: 98.3 F (36.8 C)  TempSrc: Oral  SpO2: 99%  Weight: 260 lb 12.8 oz (118.3 kg)  Height: 5' 8"  (1.727 m)    General: Well developed, well nourished, in no acute distress  Skin : Warm and dry.  Head: Normocephalic and atraumatic  Eyes: Sclera and conjunctiva clear; pupils round and reactive to light; extraocular movements intact  Ears: External normal; canals clear; tympanic membranes normal  Oropharynx: Pink, supple. No suspicious lesions  Neck: Supple without thyromegaly, adenopathy  Lungs: Respirations unlabored; clear to auscultation bilaterally without wheeze, rales, rhonchi  CVS exam: normal rate and regular rhythm.  Musculoskeletal: No deformities; no active joint inflammation  Extremities: No edema, cyanosis, clubbing  Vessels: Symmetric bilaterally  Neurologic: Alert and oriented; speech intact; face symmetrical; moves all extremities well; CNII-XII intact without focal deficit   Assessment:  1. Type 2  diabetes mellitus without complication, without long-term current use of insulin ([place])   2. Low vitamin B12 level   3. Essential hypertension     Plan:  1. Update labs today including CBC, CMP, hgba1c; he will take 1 Metformin daily until labs are back; prefers not to take more than 2/ day however; 2. Check B12 level; 3. Re-start Lisinopril 10 mg daily;  He will see his PCP for follow-up in 3 months, sooner prn.  This visit occurred during the SARS-CoV-2 public health emergency.  Safety protocols were in place, including screening questions prior to the visit, additional usage of staff PPE, and extensive cleaning of exam room while observing appropriate contact time as indicated for disinfecting solutions.     No follow-ups on file.  Orders Placed This Encounter  Procedures  . CBC    Standing Status:   Future    Standing Expiration Date:   06/28/2021  . Comp Met (CMET)    Standing Status:   Future    Standing Expiration Date:   06/28/2021  . Hemoglobin A1c    Standing Status:   Future    Standing Expiration Date:   06/28/2021  . B12    Standing Status:   Future    Standing Expiration Date:   06/28/2021    Requested Prescriptions   Signed Prescriptions Disp Refills  . lisinopril (ZESTRIL) 10 MG tablet 90 tablet 1    Sig: Take 1 tablet (10 mg total) by mouth daily.

## 2020-07-01 LAB — HEMOGLOBIN A1C: Hgb A1c MFr Bld: 6.3 % (ref 4.6–6.5)

## 2020-07-02 ENCOUNTER — Telehealth: Payer: Self-pay | Admitting: Internal Medicine

## 2020-07-02 NOTE — Telephone Encounter (Signed)
° ° °  Please return call to patient to discuss lab results °

## 2020-07-02 NOTE — Telephone Encounter (Signed)
Results given and patient educated on taking B12 vitamins.

## 2020-09-25 ENCOUNTER — Other Ambulatory Visit: Payer: Self-pay | Admitting: Internal Medicine

## 2020-09-25 NOTE — Telephone Encounter (Signed)
Please refill as per office routine med refill policy (all routine meds refilled for 3 mo or monthly per pt preference up to one year from last visit, then month to month grace period for 3 mo, then further med refills will have to be denied)  

## 2020-10-23 ENCOUNTER — Other Ambulatory Visit: Payer: Self-pay

## 2020-10-25 ENCOUNTER — Ambulatory Visit: Payer: BC Managed Care – PPO | Admitting: Family

## 2020-10-25 ENCOUNTER — Other Ambulatory Visit: Payer: Self-pay

## 2020-10-25 VITALS — BP 170/94 | HR 94 | Temp 98.6°F | Ht 68.0 in | Wt 264.0 lb

## 2020-10-25 DIAGNOSIS — I10 Essential (primary) hypertension: Secondary | ICD-10-CM | POA: Diagnosis not present

## 2020-10-25 DIAGNOSIS — E119 Type 2 diabetes mellitus without complications: Secondary | ICD-10-CM | POA: Diagnosis not present

## 2020-10-25 NOTE — Progress Notes (Signed)
Jerome [name] is a 50 y.o. male with the following history as recorded in Epic[employer]:  Patient Active Problem List   Diagnosis Date Noted  . Cervical radiculopathy 07/23/2016  . Thoracic back pain 07/23/2016  . Chest pain 07/21/2016  . Cough 10/25/2014  . Increased prostate specific antigen (PSA) velocity 04/26/2014  . Loss of weight 10/24/2012  . Urinary frequency 10/24/2012  . Edema 03/17/2011  . Diabetes ([place]) 03/08/2011  . Preventative health care 03/08/2011  . CONSTIPATION 10/29/2009  . HEMATOCHEZIA 10/29/2009  . Morbid obesity ([place]) 04/07/2007  . ALLERGIC RHINITIS 04/07/2007  . Asthma 04/07/2007  . GERD 04/07/2007  . Lumbar back pain 04/07/2007  . Essential hypertension 04/01/2007    Current Outpatient Medications  Medication Sig Dispense Refill  . aspirin EC 81 MG tablet Take 1 tablet (81 mg total) by mouth daily. 90 tablet 3  . gabapentin (NEURONTIN) 300 MG capsule Take 1 capsule (300 mg total) by mouth 3 (three) times daily as needed (nerve pain). 90 capsule 3  . glucose blood (ACCU-CHEK AVIVA PLUS) test strip Test blood sugar twice a day E11.9 200 each 11  . lisinopril (ZESTRIL) 10 MG tablet Take 1 tablet (10 mg total) by mouth daily. 90 tablet 1  . metFORMIN (GLUCOPHAGE-XR) 500 MG 24 hr tablet TAKE 2 TABLET BY MOUTH EVERY DAY WITH BREAKFAST OVERDUE FOR ANNUAL APPT WITH LABS MUST SEE PROVIDER FOR FUTURE REFILLS 60 tablet 2   No current facility-administered medications for this visit.    Allergies: Patient has no known allergies.  Past Medical History:  Diagnosis Date  . ALLERGIC RHINITIS 04/07/2007  . ASTHMA 04/07/2007  . CONSTIPATION 10/29/2009  . Diabetes mellitus type II, non insulin dependent ([place]) 03/08/2011   Improved w/ weight loss, A1c followed by primary care  . GERD 04/07/2007  . HEMATOCHEZIA 10/29/2009  . HYPERTENSION 04/01/2007  . LOW BACK PAIN 04/07/2007  . Morbid obesity ([place]) 04/07/2007    Past Surgical History:  Procedure Laterality Date  .  CHOLECYSTECTOMY  2014    Family History  Problem Relation Age of Onset  . Stroke Other   . Diabetes Mellitus II Father   . Prostate cancer Father   . Throat cancer Maternal Uncle   . CAD Neg Hx     Social History   Tobacco Use  . Smoking status: Former Smoker    Packs/day: 0.10  . Smokeless tobacco: Never Used  Substance Use Topics  . Alcohol use: Yes    Subjective:  Patient overdue for see his PCP- was due for return visit in January 2022;  Concerned that blood pressure is elevated; has been having some "sweating" in his hands/ feet; notes he is not sleeping well recently; Denies any chest pain, shortness of breath, blurred vision or headache. Does take Lisinopril 10 mg daily- notes this was started as part of treatment for diabetes/ not hypertension.  Blood sugar readings have been very well controlled- Hgba1c in November was 6.3;      Objective:  Vitals:   10/25/20 0958  BP: (!) 170/94  Pulse: 94  Temp: 98.6 F (37 C)  TempSrc: Oral  SpO2: 98%  Weight: 264 lb (119.7 kg)  Height: 5\' 8"  (1.727 m)    General: Well developed, well nourished, in no acute distress  Skin : Warm and dry.  Head: Normocephalic and atraumatic  Eyes: Sclera and conjunctiva clear; pupils round and reactive to light; extraocular movements intact  Lungs: Respirations unlabored; clear to auscultation bilaterally without wheeze, rales, rhonchi  CVS exam: normal rate and regular rhythm.  Neurologic: Alert and oriented; speech intact; face symmetrical; moves all extremities well; CNII-XII intact without focal deficit   Assessment:  1. Essential hypertension   2. Type 2 diabetes mellitus without complication, without long-term current use of insulin ([place])     Plan:   1. Uncontrolled; he is asked to start taking 20 mg of Lisinopril daily; needs to see his PCP in 2-3 weeks for re-check and follow-up; 2. Continue Metformin as prescribed; Hgba1c was excellent in November; can plan to get labs done at  next OV with his PCP;  This visit occurred during the SARS-CoV-2 public health emergency.  Safety protocols were in place, including screening questions prior to the visit, additional usage of staff PPE, and extensive cleaning of exam room while observing appropriate contact time as indicated for disinfecting solutions.      Return in about 3 weeks (around 11/15/2020), or with Dr. [name].  No orders of the defined types were placed in this encounter.   Requested Prescriptions    No prescriptions requested or ordered in this encounter

## 2020-11-11 ENCOUNTER — Encounter: Payer: Self-pay | Admitting: Internal Medicine

## 2020-11-11 ENCOUNTER — Ambulatory Visit: Payer: BC Managed Care – PPO | Admitting: Internal Medicine

## 2020-11-11 ENCOUNTER — Other Ambulatory Visit: Payer: Self-pay

## 2020-11-11 VITALS — BP 136/80 | HR 68 | Temp 98.6°F | Ht 68.0 in | Wt 261.0 lb

## 2020-11-11 DIAGNOSIS — I10 Essential (primary) hypertension: Secondary | ICD-10-CM

## 2020-11-11 DIAGNOSIS — J4520 Mild intermittent asthma, uncomplicated: Secondary | ICD-10-CM | POA: Diagnosis not present

## 2020-11-11 DIAGNOSIS — E1165 Type 2 diabetes mellitus with hyperglycemia: Secondary | ICD-10-CM | POA: Diagnosis not present

## 2020-11-11 MED ORDER — ALBUTEROL SULFATE HFA 108 (90 BASE) MCG/ACT IN AERS: 2.0000 | INHALATION_SPRAY | Freq: Four times a day (QID) | RESPIRATORY_TRACT | 11 refills | Status: DC | PRN

## 2020-11-11 MED ORDER — LISINOPRIL 40 MG PO TABS: 40.0000 mg | ORAL_TABLET | Freq: Every day | ORAL | 3 refills | Status: DC

## 2020-11-11 NOTE — Progress Notes (Signed)
Patient ID: Jerome [name], male   DOB: [birth year], 50 y.o.   MRN: [id number]        Chief Complaint: f/u htn, dm, astma  also recent right renal stone 07/2020       HPI:  Jerome [name] is a 50 y.o. male here overall doing ok, Pt denies chest pain, increased sob or doe, wheezing, orthopnea, PND, increased LE swelling, palpitations, dizziness or syncope.  BP elevated at home similar to today in the past few wks.  Denies new worsening neuro s/s.   Pt denies polydipsia, polyuria,  Pt denies fever, night sweats, loss of appetite, or other constitutional symptoms  S/p recent kidney stone dec 2021 right side, pain resolved, not sure if passed or dissolve, but did not have to have procedure per urology in [place].  Wt down 3 lbs from last visit with better diet, starting to work on being more active with treadmill at work.   Denies urinary symptoms such as dysuria, frequency, urgency, flank pain, hematuria or n/v, fever, chills. Wt Readings from Last 3 Encounters:  11/11/20 261 lb (118.4 kg)  10/25/20 264 lb (119.7 kg)  06/28/20 260 lb 12.8 oz (118.3 kg)   BP Readings from Last 3 Encounters:  11/11/20 136/80  10/25/20 (!) 170/94  06/28/20 (!) 160/90         Past Medical History:  Diagnosis Date  . ALLERGIC RHINITIS 04/07/2007  . ASTHMA 04/07/2007  . CONSTIPATION 10/29/2009  . Diabetes mellitus type II, non insulin dependent ([place]) 03/08/2011   Improved w/ weight loss, A1c followed by primary care  . GERD 04/07/2007  . HEMATOCHEZIA 10/29/2009  . HYPERTENSION 04/01/2007  . LOW BACK PAIN 04/07/2007  . Morbid obesity ([place]) 04/07/2007   Past Surgical History:  Procedure Laterality Date  . CHOLECYSTECTOMY  2014    reports that he has quit smoking. He smoked 0.10 packs per day. He has never used smokeless tobacco. He reports current alcohol use. He reports that he does not use drugs. family history includes Diabetes Mellitus II in his father; Prostate cancer in his father; Stroke in an other family member;  Throat cancer in his maternal uncle. No Known Allergies Current Outpatient Medications on File Prior to Visit  Medication Sig Dispense Refill  . aspirin EC 81 MG tablet Take 1 tablet (81 mg total) by mouth daily. 90 tablet 3  . gabapentin (NEURONTIN) 300 MG capsule Take 1 capsule (300 mg total) by mouth 3 (three) times daily as needed (nerve pain). 90 capsule 3  . glucose blood (ACCU-CHEK AVIVA PLUS) test strip Test blood sugar twice a day E11.9 200 each 11  . metFORMIN (GLUCOPHAGE-XR) 500 MG 24 hr tablet TAKE 2 TABLET BY MOUTH EVERY DAY WITH BREAKFAST OVERDUE FOR ANNUAL APPT WITH LABS MUST SEE PROVIDER FOR FUTURE REFILLS 60 tablet 2   No current facility-administered medications on file prior to visit.        ROS:  All others reviewed and negative.  Objective        PE:  BP 136/80   Pulse 68   Temp 98.6 F (37 C) (Oral)   Ht 5\' 8"  (1.727 m)   Wt 261 lb (118.4 kg)   SpO2 96%   BMI 39.68 kg/m                 Constitutional: Pt appears in NAD               HENT: Head: NCAT.  Right Ear: External ear normal.                 Left Ear: External ear normal.                Eyes: . Pupils are equal, round, and reactive to light. Conjunctivae and EOM are normal               Nose: without d/c or deformity               Neck: Neck supple. Gross normal ROM               Cardiovascular: Normal rate and regular rhythm.                 Pulmonary/Chest: Effort normal and breath sounds without rales or wheezing.                Abd:  Soft, NT, ND, + BS, no organomegaly               Neurological: Pt is alert. At baseline orientation, motor grossly intact               Skin: Skin is warm. No rashes, no other new lesions, LE edema - none               Psychiatric: Pt behavior is normal without agitation   Micro: none  Cardiac tracings I have personally interpreted today:  none  Pertinent Radiological findings (summarize): none   Lab Results  Component Value Date   WBC 6.3  06/28/2020   HGB 14.3 06/28/2020   HCT 42.6 06/28/2020   PLT 264.0 06/28/2020   GLUCOSE 133 (H) 06/28/2020   CHOL 128 03/22/2020   TRIG 99 03/22/2020   HDL 43 03/22/2020   LDLCALC 67 03/22/2020   ALT 24 06/28/2020   AST 18 06/28/2020   NA 141 06/28/2020   K 3.8 06/28/2020   CL 105 06/28/2020   CREATININE 1.00 06/28/2020   BUN 16 06/28/2020   CO2 29 06/28/2020   TSH 1.98 03/22/2020   PSA 1.1 03/22/2020   HGBA1C 6.3 06/28/2020   MICROALBUR 0.8 03/22/2020   Assessment/Plan:  Jerome [name] is a 50 y.o. Black or African American [2] male with  has a past medical history of ALLERGIC RHINITIS (04/07/2007), ASTHMA (04/07/2007), CONSTIPATION (10/29/2009), Diabetes mellitus type II, non insulin dependent ([place]) (03/08/2011), GERD (04/07/2007), HEMATOCHEZIA (10/29/2009), HYPERTENSION (04/01/2007), LOW BACK PAIN (04/07/2007), and Morbid obesity ([place]) (04/07/2007).  Essential hypertension BP Readings from Last 3 Encounters:  11/11/20 136/80  10/25/20 (!) 170/94  06/28/20 (!) 160/90   Stable, pt to icnrease medical treatment at lisinopril 40  Diabetes ([place]) Lab Results  Component Value Date   HGBA1C 6.3 06/28/2020   Stable, pt to continue current medical treatment - metformin   Asthma Stable, cont current med tx - ventolin hfa prn   Followup: Return in about 4 months (around 03/12/2021).  [name], MD 11/20/2020 10:24 PM [facility]

## 2020-11-11 NOTE — Patient Instructions (Addendum)
Ok to increase the lisinopril to 40 mg per day  Please continue all other medications as before, and refills have been done if requested - the inhaler  Please have the pharmacy call with any other refills you may need.  Please continue your efforts at being more active, low cholesterol diet, and weight control.  Please keep your appointments with your specialists as you may have planned  Please make an Appointment to return after March 12, 2021 (due to the insurance requirements)

## 2020-11-15 ENCOUNTER — Ambulatory Visit: Payer: BC Managed Care – PPO | Admitting: Internal Medicine

## 2020-11-20 ENCOUNTER — Encounter: Payer: Self-pay | Admitting: Internal Medicine

## 2020-11-20 NOTE — Assessment & Plan Note (Signed)
Stable, cont current med tx - ventolin hfa prn  

## 2020-11-20 NOTE — Assessment & Plan Note (Signed)
Lab Results  Component Value Date   HGBA1C 6.3 06/28/2020   Stable, pt to continue current medical treatment - metformin

## 2020-11-20 NOTE — Assessment & Plan Note (Addendum)
BP Readings from Last 3 Encounters:  11/11/20 136/80  10/25/20 (!) 170/94  06/28/20 (!) 160/90   Stable, pt to icnrease medical treatment at lisinopril 40

## 2020-12-26 ENCOUNTER — Other Ambulatory Visit: Payer: Self-pay | Admitting: Family

## 2021-05-13 ENCOUNTER — Telehealth: Payer: Self-pay | Admitting: Internal Medicine

## 2021-05-13 NOTE — Telephone Encounter (Signed)
1.Medication Requested:  metFORMIN (GLUCOPHAGE-XR) 500 MG 24 hr tablet  2. Pharmacy (Name, Street, [facility]):  [place], [place] - [address]  Phone:  [phone] Fax:  [phone]   3. On Med List: yes  4. Last Visit with PCP: 03.28.22  5. Next visit date with PCP: n/a   Agent: Please be advised that RX refills may take up to 3 business days. We ask that you follow-up with your pharmacy.

## 2021-05-14 MED ORDER — METFORMIN HCL ER 500 MG PO TB24
ORAL_TABLET | ORAL | 0 refills | Status: DC
Start: 2021-05-14 — End: 2021-05-16

## 2021-05-16 MED ORDER — METFORMIN HCL ER 500 MG PO TB24: ORAL_TABLET | ORAL | 0 refills | Status: DC

## 2021-05-16 NOTE — Telephone Encounter (Signed)
Patient requesting order be changed to 90 day supply for insurance to cover  Please resend

## 2021-05-16 NOTE — Addendum Note (Signed)
Addended by: Terence Lux A on: 05/16/2021 11:48 AM   Modules accepted: Orders

## 2021-05-28 ENCOUNTER — Telehealth: Payer: Self-pay | Admitting: Internal Medicine

## 2021-05-28 DIAGNOSIS — Z0000 Encounter for general adult medical examination without abnormal findings: Secondary | ICD-10-CM

## 2021-05-28 DIAGNOSIS — E538 Deficiency of other specified B group vitamins: Secondary | ICD-10-CM

## 2021-05-28 DIAGNOSIS — E559 Vitamin D deficiency, unspecified: Secondary | ICD-10-CM

## 2021-05-28 DIAGNOSIS — E1165 Type 2 diabetes mellitus with hyperglycemia: Secondary | ICD-10-CM

## 2021-05-28 NOTE — Telephone Encounter (Signed)
Ok labs ordered 

## 2021-05-28 NOTE — Telephone Encounter (Signed)
Patient notified

## 2021-05-28 NOTE — Telephone Encounter (Signed)
PT requesting lab order prior to appt on 05-30-2021. PT concerned about overall health.

## 2021-05-29 ENCOUNTER — Other Ambulatory Visit (INDEPENDENT_AMBULATORY_CARE_PROVIDER_SITE_OTHER): Payer: BC Managed Care – PPO

## 2021-05-29 DIAGNOSIS — E538 Deficiency of other specified B group vitamins: Secondary | ICD-10-CM

## 2021-05-29 DIAGNOSIS — E559 Vitamin D deficiency, unspecified: Secondary | ICD-10-CM

## 2021-05-29 DIAGNOSIS — R7989 Other specified abnormal findings of blood chemistry: Secondary | ICD-10-CM

## 2021-05-29 DIAGNOSIS — Z0000 Encounter for general adult medical examination without abnormal findings: Secondary | ICD-10-CM

## 2021-05-29 DIAGNOSIS — E1165 Type 2 diabetes mellitus with hyperglycemia: Secondary | ICD-10-CM | POA: Diagnosis not present

## 2021-05-29 DIAGNOSIS — Z125 Encounter for screening for malignant neoplasm of prostate: Secondary | ICD-10-CM

## 2021-05-29 LAB — PSA: PSA: 1.33 ng/mL (ref 0.10–4.00)

## 2021-05-29 LAB — LIPID PANEL
Cholesterol: 115 mg/dL (ref 0–200)
HDL: 40.1 mg/dL (ref >39.00)
LDL Cholesterol: 51 mg/dL (ref 0–99)
NonHDL: 74.7
Total CHOL/HDL Ratio: 3
Triglycerides: 121 mg/dL (ref 0.0–149.0)
VLDL: 24.2 mg/dL (ref 0.0–40.0)

## 2021-05-29 LAB — HEPATIC FUNCTION PANEL
ALT: 34 U/L (ref 0–53)
AST: 23 U/L (ref 0–37)
Albumin: 4.3 g/dL (ref 3.5–5.2)
Alkaline Phosphatase: 61 U/L (ref 39–117)
Bilirubin, Direct: 0.2 mg/dL (ref 0.0–0.3)
Total Bilirubin: 0.8 mg/dL (ref 0.2–1.2)
Total Protein: 7.1 g/dL (ref 6.0–8.3)

## 2021-05-29 LAB — CBC WITH DIFFERENTIAL/PLATELET
Basophils Absolute: 0.1 10*3/uL (ref 0.0–0.1)
Basophils Relative: 0.7 % (ref 0.0–3.0)
Eosinophils Absolute: 0.2 10*3/uL (ref 0.0–0.7)
Eosinophils Relative: 3.3 % (ref 0.0–5.0)
HCT: 41.7 % (ref 39.0–52.0)
Hemoglobin: 13.9 g/dL (ref 13.0–17.0)
Lymphocytes Relative: 40 % (ref 12.0–46.0)
Lymphs Abs: 2.8 10*3/uL (ref 0.7–4.0)
MCHC: 33.3 g/dL (ref 30.0–36.0)
MCV: 86.5 fl (ref 78.0–100.0)
Monocytes Absolute: 0.5 10*3/uL (ref 0.1–1.0)
Monocytes Relative: 7.9 % (ref 3.0–12.0)
Neutro Abs: 3.3 10*3/uL (ref 1.4–7.7)
Neutrophils Relative %: 48.1 % (ref 43.0–77.0)
Platelets: 262 10*3/uL (ref 150.0–400.0)
RBC: 4.82 Mil/uL (ref 4.22–5.81)
RDW: 11.8 % (ref 11.5–15.5)
WBC: 6.9 10*3/uL (ref 4.0–10.5)

## 2021-05-29 LAB — URINALYSIS, ROUTINE W REFLEX MICROSCOPIC
Bilirubin Urine: NEGATIVE
Hgb urine dipstick: NEGATIVE
Ketones, ur: NEGATIVE
Leukocytes,Ua: NEGATIVE
Nitrite: NEGATIVE
Specific Gravity, Urine: 1.01 (ref 1.000–1.030)
Total Protein, Urine: NEGATIVE
Urine Glucose: NEGATIVE
Urobilinogen, UA: 0.2 (ref 0.0–1.0)
pH: 6 (ref 5.0–8.0)

## 2021-05-29 LAB — BASIC METABOLIC PANEL
BUN: 12 mg/dL (ref 6–23)
CO2: 26 mEq/L (ref 19–32)
Calcium: 9.5 mg/dL (ref 8.4–10.5)
Chloride: 106 mEq/L (ref 96–112)
Creatinine, Ser: 1.03 mg/dL (ref 0.40–1.50)
GFR: 84.35 mL/min (ref >60.00)
Glucose, Bld: 135 mg/dL — ABNORMAL HIGH (ref 70–99)
Potassium: 4.1 mEq/L (ref 3.5–5.1)
Sodium: 139 mEq/L (ref 135–145)

## 2021-05-29 LAB — MICROALBUMIN / CREATININE URINE RATIO
Creatinine,U: 91.1 mg/dL
Microalb Creat Ratio: 0.8 mg/g (ref 0.0–30.0)
Microalb, Ur: <0.7 mg/dL (ref 0.0–1.9)

## 2021-05-29 LAB — TSH: TSH: 1.58 u[IU]/mL (ref 0.35–5.50)

## 2021-05-29 LAB — VITAMIN B12: Vitamin B-12: 1125 pg/mL — ABNORMAL HIGH (ref 211–911)

## 2021-05-29 LAB — HEMOGLOBIN A1C: Hgb A1c MFr Bld: 7 % — ABNORMAL HIGH (ref 4.6–6.5)

## 2021-05-29 LAB — VITAMIN D 25 HYDROXY (VIT D DEFICIENCY, FRACTURES): VITD: 15.94 ng/mL — ABNORMAL LOW (ref 30.00–100.00)

## 2021-05-30 ENCOUNTER — Ambulatory Visit (INDEPENDENT_AMBULATORY_CARE_PROVIDER_SITE_OTHER): Payer: BC Managed Care – PPO | Admitting: Internal Medicine

## 2021-05-30 ENCOUNTER — Other Ambulatory Visit: Payer: Self-pay

## 2021-05-30 ENCOUNTER — Encounter: Payer: Self-pay | Admitting: Internal Medicine

## 2021-05-30 ENCOUNTER — Other Ambulatory Visit: Payer: Self-pay | Admitting: Internal Medicine

## 2021-05-30 VITALS — BP 136/76 | HR 103 | Temp 99.1°F | Ht 68.0 in | Wt 262.0 lb

## 2021-05-30 DIAGNOSIS — Z1211 Encounter for screening for malignant neoplasm of colon: Secondary | ICD-10-CM

## 2021-05-30 DIAGNOSIS — I10 Essential (primary) hypertension: Secondary | ICD-10-CM | POA: Diagnosis not present

## 2021-05-30 DIAGNOSIS — E559 Vitamin D deficiency, unspecified: Secondary | ICD-10-CM

## 2021-05-30 DIAGNOSIS — F419 Anxiety disorder, unspecified: Secondary | ICD-10-CM

## 2021-05-30 DIAGNOSIS — E1165 Type 2 diabetes mellitus with hyperglycemia: Secondary | ICD-10-CM | POA: Diagnosis not present

## 2021-05-30 DIAGNOSIS — Z0001 Encounter for general adult medical examination with abnormal findings: Secondary | ICD-10-CM

## 2021-05-30 MED ORDER — METFORMIN HCL ER 500 MG PO TB24: ORAL_TABLET | ORAL | 3 refills | Status: DC

## 2021-05-30 NOTE — Patient Instructions (Signed)
We have discussed the Cardiac CT Score test to measure the calcification level (if any) in your heart arteries.  This test has been ordered in our [facility], so please call [facility] directly, as they prefer this, at [phone] to be scheduled.  Please take OTC Vitamin D3 at 2000 units per day, indefinitely  Ok to increase the metformin ER 500 mg to 3 pills in the AM  Please continue all other medications as before, and refills have been done if requested.  Please have the pharmacy call with any other refills you may need.  Please continue your efforts at being more active, low cholesterol diet, and weight control.  You are otherwise up to date with prevention measures today.  Please keep your appointments with your specialists as you may have planned  Please make an Appointment to return in 6 months, or sooner if needed, unless you are able to transfer your primary care to a physician in [place] before then     Also with Lab Appointment for testing done 3-5 days before at the [place] (so this is for TWO appointments - please see the scheduling desk as you leave) - UNLESS you go to the [facility] lab where no appt is needed

## 2021-05-30 NOTE — Progress Notes (Signed)
Patient ID: Jerome [name], male   DOB: Jul 08, 1971, 50 y.o.   MRN: [id number]         Chief Complaint:: wellness exam and Office Visit (Elevated BP; jittery, heart racing, sweaty hands,tingling in fingertips)  Anxiety, dm, htn, low vitd       HPI:  Jerome [name] is a 50 y.o. male here for wellness exam; declines optho, but for colonoscopy referral, shingrix, and o/w up to date                        Also c/o anxiety symptoms as per CC above; Pt denies chest pain, increased sob or doe, wheezing, orthopnea, PND, increased LE swelling, palpitations, dizziness or syncope.   Pt denies polydipsia, polyuria, or new focal neuro s/s.   Pt denies fever, wt loss, night sweats, loss of appetite, or other constitutional symptoms  BP o/w ok at home < 140/90.  Not taking Vit D.  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    Wt Readings from Last 3 Encounters:  05/30/21 262 lb (118.8 kg)  11/11/20 261 lb (118.4 kg)  10/25/20 264 lb (119.7 kg)   BP Readings from Last 3 Encounters:  05/30/21 136/76  11/11/20 136/80  10/25/20 (!) 170/94   Immunization History  Administered Date(s) Administered   Influenza Whole 08/17/2001   PFIZER(Purple Top)SARS-COV-2 Vaccination 03/22/2020, 04/21/2020   Td 08/17/1998, 09/06/2008   Tdap 01/05/2019   There are no preventive care reminders to display for this patient.     Past Medical History:  Diagnosis Date   ALLERGIC RHINITIS 04/07/2007   ASTHMA 04/07/2007   CONSTIPATION 10/29/2009   Diabetes mellitus type II, non insulin dependent ([place]) 03/08/2011   Improved w/ weight loss, A1c followed by primary care   GERD 04/07/2007   HEMATOCHEZIA 10/29/2009   HYPERTENSION 04/01/2007   LOW BACK PAIN 04/07/2007   Morbid obesity ([place]) 04/07/2007   Past Surgical History:  Procedure Laterality Date   CHOLECYSTECTOMY  2014    reports that he has quit smoking. His smoking use included cigarettes. He smoked an average of .1 packs per day. He  has never used smokeless tobacco. He reports current alcohol use. He reports that he does not use drugs. family history includes Diabetes Mellitus II in his father; Prostate cancer in his father; Stroke in an other family member; Throat cancer in his maternal uncle. No Known Allergies Current Outpatient Medications on File Prior to Visit  Medication Sig Dispense Refill   albuterol (VENTOLIN HFA) 108 (90 Base) MCG/ACT inhaler Inhale 2 puffs into the lungs every 6 (six) hours as needed for wheezing or shortness of breath. 8 g 11   aspirin EC 81 MG tablet Take 1 tablet (81 mg total) by mouth daily. 90 tablet 3   gabapentin (NEURONTIN) 300 MG capsule Take 1 capsule (300 mg total) by mouth 3 (three) times daily as needed (nerve pain). 90 capsule 3   glucose blood (ACCU-CHEK AVIVA PLUS) test strip Test blood sugar twice a day E11.9 200 each 11   lisinopril (ZESTRIL) 40 MG tablet Take 1 tablet (40 mg total) by mouth daily. 90 tablet 3   No current facility-administered medications on file prior to visit.        ROS:  All others reviewed and negative.  Objective        PE:  BP 136/76 (BP Location: Right Arm, Patient Position: Sitting, Cuff Size: Large)  Pulse (!) 103   Temp 99.1 F (37.3 C) (Oral)   Ht 5\' 8"  (1.727 m)   Wt 262 lb (118.8 kg)   SpO2 98%   BMI 39.84 kg/m                 Constitutional: Pt appears in NAD               HENT: Head: NCAT.                Right Ear: External ear normal.                 Left Ear: External ear normal.                Eyes: . Pupils are equal, round, and reactive to light. Conjunctivae and EOM are normal               Nose: without d/c or deformity               Neck: Neck supple. Gross normal ROM               Cardiovascular: Normal rate and regular rhythm.                 Pulmonary/Chest: Effort normal and breath sounds without rales or wheezing.                Abd:  Soft, NT, ND, + BS, no organomegaly               Neurological: Pt is alert. At  baseline orientation, motor grossly intact               Skin: Skin is warm. No rashes, no other new lesions, LE edema - none               Psychiatric: Pt behavior is normal without agitation, mild nervous  Micro: none  Cardiac tracings I have personally interpreted today:  none  Pertinent Radiological findings (summarize): none   Lab Results  Component Value Date   WBC 6.9 05/29/2021   HGB 13.9 05/29/2021   HCT 41.7 05/29/2021   PLT 262.0 05/29/2021   GLUCOSE 135 (H) 05/29/2021   CHOL 115 05/29/2021   TRIG 121.0 05/29/2021   HDL 40.10 05/29/2021   LDLCALC 51 05/29/2021   ALT 34 05/29/2021   AST 23 05/29/2021   NA 139 05/29/2021   K 4.1 05/29/2021   CL 106 05/29/2021   CREATININE 1.03 05/29/2021   BUN 12 05/29/2021   CO2 26 05/29/2021   TSH 1.58 05/29/2021   PSA 1.33 05/29/2021   HGBA1C 7.0 (H) 05/29/2021   MICROALBUR <0.7 05/29/2021   Assessment/Plan:  Jerome [name] is a 50 y.o. Black or African American [2] male with  has a past medical history of ALLERGIC RHINITIS (04/07/2007), ASTHMA (04/07/2007), CONSTIPATION (10/29/2009), Diabetes mellitus type II, non insulin dependent ([place]) (03/08/2011), GERD (04/07/2007), HEMATOCHEZIA (10/29/2009), HYPERTENSION (04/01/2007), LOW BACK PAIN (04/07/2007), and Morbid obesity ([place]) (04/07/2007).  Encounter for well adult exam with abnormal findings Age and sex appropriate education and counseling updated with regular exercise and diet Referrals for preventative services - for colonoscopy Immunizations addressed - declines shingrix Smoking counseling  - none needed Evidence for depression or other mood disorder - mild persistent anxiety Most recent labs reviewed. I have personally reviewed and have noted: 1) the patient's medical and social history 2) The patient's current medications and supplements 3) The patient's height, weight, and BMI  have been recorded in the chart   Anxiety Mild situational worsening recenlty, declines specific  tx or referal counseling for now  Essential hypertension BP Readings from Last 3 Encounters:  05/30/21 136/76  11/11/20 136/80  10/25/20 (!) 170/94   Stable, pt to continue medical treatment lisinopril   Diabetes ([place]) Lab Results  Component Value Date   HGBA1C 7.0 (H) 05/29/2021   Mild uncontrolled, goal a1 < 7, , pt to increase current medical treatment metfomrin ER 500 to 3 tabs in the AM   Vitamin D deficiency Last vitamin D Lab Results  Component Value Date   VD25OH 15.94 (L) 05/29/2021   Low, to start oral replacement  Followup: No follow-ups on file.  [name], MD 06/01/2021 6:49 PM [facility]

## 2021-06-01 DIAGNOSIS — F419 Anxiety disorder, unspecified: Secondary | ICD-10-CM | POA: Insufficient documentation

## 2021-06-01 DIAGNOSIS — E559 Vitamin D deficiency, unspecified: Secondary | ICD-10-CM | POA: Insufficient documentation

## 2021-06-01 NOTE — Assessment & Plan Note (Addendum)
Lab Results  Component Value Date   HGBA1C 7.0 (H) 05/29/2021   Mild uncontrolled, goal a1 < 7, , pt to increase current medical treatment metfomrin ER 500 to 3 tabs in the AM

## 2021-06-01 NOTE — Assessment & Plan Note (Signed)
Mild situational worsening recenlty, declines specific tx or referal counseling for now

## 2021-06-01 NOTE — Assessment & Plan Note (Signed)
BP Readings from Last 3 Encounters:  05/30/21 136/76  11/11/20 136/80  10/25/20 (!) 170/94   Stable, pt to continue medical treatment lisinopril

## 2021-06-01 NOTE — Assessment & Plan Note (Signed)
Age and sex appropriate education and counseling updated with regular exercise and diet Referrals for preventative services - for colonoscopy Immunizations addressed - declines shingrix Smoking counseling  - none needed Evidence for depression or other mood disorder - mild persistent anxiety Most recent labs reviewed. I have personally reviewed and have noted: 1) the patient's medical and social history 2) The patient's current medications and supplements 3) The patient's height, weight, and BMI have been recorded in the chart

## 2021-06-01 NOTE — Assessment & Plan Note (Signed)
Last vitamin D Lab Results  Component Value Date   VD25OH 15.94 (L) 05/29/2021   Low, to start oral replacement

## 2021-06-11 MED ORDER — METFORMIN HCL ER 500 MG PO TB24: ORAL_TABLET | ORAL | 3 refills | Status: DC

## 2021-06-11 NOTE — Addendum Note (Signed)
Addended by: Biagio Borg on: 06/11/2021 12:50 PM   Modules accepted: Orders

## 2021-06-12 ENCOUNTER — Encounter (HOSPITAL_COMMUNITY): Payer: Self-pay

## 2021-06-12 ENCOUNTER — Emergency Department (HOSPITAL_COMMUNITY)
Admission: EM | Admit: 2021-06-12 | Discharge: 2021-06-12 | Disposition: A | Discharge status: Home / Self Care | Payer: BC Managed Care – PPO | Attending: Emergency Medicine | Admitting: Emergency Medicine

## 2021-06-12 DIAGNOSIS — R002 Palpitations: Secondary | ICD-10-CM | POA: Insufficient documentation

## 2021-06-12 DIAGNOSIS — Z87891 Personal history of nicotine dependence: Secondary | ICD-10-CM | POA: Diagnosis not present

## 2021-06-12 DIAGNOSIS — R42 Dizziness and giddiness: Secondary | ICD-10-CM | POA: Diagnosis not present

## 2021-06-12 DIAGNOSIS — I10 Essential (primary) hypertension: Secondary | ICD-10-CM | POA: Insufficient documentation

## 2021-06-12 DIAGNOSIS — Z79899 Other long term (current) drug therapy: Secondary | ICD-10-CM | POA: Insufficient documentation

## 2021-06-12 DIAGNOSIS — Z7984 Long term (current) use of oral hypoglycemic drugs: Secondary | ICD-10-CM | POA: Diagnosis not present

## 2021-06-12 DIAGNOSIS — Z7982 Long term (current) use of aspirin: Secondary | ICD-10-CM | POA: Diagnosis not present

## 2021-06-12 DIAGNOSIS — E119 Type 2 diabetes mellitus without complications: Secondary | ICD-10-CM | POA: Insufficient documentation

## 2021-06-12 DIAGNOSIS — J45909 Unspecified asthma, uncomplicated: Secondary | ICD-10-CM | POA: Diagnosis not present

## 2021-06-12 LAB — CBC WITH DIFFERENTIAL/PLATELET
Abs Immature Granulocytes: 0.01 10*3/uL (ref 0.00–0.07)
Basophils Absolute: 0 10*3/uL (ref 0.0–0.1)
Basophils Relative: 0 %
Eosinophils Absolute: 0.1 10*3/uL (ref 0.0–0.5)
Eosinophils Relative: 1 %
HCT: 44.7 % (ref 39.0–52.0)
Hemoglobin: 14.8 g/dL (ref 13.0–17.0)
Immature Granulocytes: 0 %
Lymphocytes Relative: 28 %
Lymphs Abs: 1.7 10*3/uL (ref 0.7–4.0)
MCH: 29.1 pg (ref 26.0–34.0)
MCHC: 33.1 g/dL (ref 30.0–36.0)
MCV: 87.8 fL (ref 80.0–100.0)
Monocytes Absolute: 0.6 10*3/uL (ref 0.1–1.0)
Monocytes Relative: 9 %
Neutro Abs: 3.7 10*3/uL (ref 1.7–7.7)
Neutrophils Relative %: 62 %
Platelets: 297 10*3/uL (ref 150–400)
RBC: 5.09 MIL/uL (ref 4.22–5.81)
RDW: 12 % (ref 11.5–15.5)
WBC: 6 10*3/uL (ref 4.0–10.5)
nRBC: 0 % (ref 0.0–0.2)

## 2021-06-12 LAB — BASIC METABOLIC PANEL
Anion gap: 8 (ref 5–15)
BUN: 15 mg/dL (ref 6–20)
CO2: 23 mmol/L (ref 22–32)
Calcium: 9.5 mg/dL (ref 8.9–10.3)
Chloride: 107 mmol/L (ref 98–111)
Creatinine, Ser: 1.05 mg/dL (ref 0.61–1.24)
GFR, Estimated: >60 mL/min (ref >60)
Glucose, Bld: 118 mg/dL — ABNORMAL HIGH (ref 70–99)
Potassium: 4.1 mmol/L (ref 3.5–5.1)
Sodium: 138 mmol/L (ref 135–145)

## 2021-06-12 LAB — TSH: TSH: 1.095 u[IU]/mL (ref 0.350–4.500)

## 2021-06-12 NOTE — ED Provider Notes (Signed)
[facility] DEPT Provider Note   CSN: [id number] Arrival date & time: 06/12/21  1831     History Chief Complaint  Patient presents with   Hypertension    Keionte Lipinski is a 50 y.o. male.  The history is provided by the patient and medical records. No language interpreter was used.  Hypertension   50 year old male significant history of hypertension, asthma, anxiety, GERD, diabetes presented with complaints of heart palpitation.  Patient report for the past 3 weeks he has had intermittent episodes of heart palpitation usually lasting for over an hour.  Along with these episodes he endorsed feeling a bit lightheadedness and not feeling well.  There are no associated pain no fever chills productive cough nausea vomiting diarrhea constipation back pain abdominal pain or chest pain.  He did try to decrease caffeine use and chocolate for the past week but have not noticed any significant improvement.  Reported having somewhat of a similar symptoms earlier this year which did improve after his doctor increase his lisinopril.  He denies having increasing stress as any recent sickness and also denies taking any over-the-counter medication except for vitamin D as instructed by his doctor.  He mention sharing his symptoms to his PCP 2 weeks ago but there are no specific plan except a cardiac CT scan scheduled in the near future.  He mentioned his blood sugar has been in control.  He denies any active heart palpitation at this time.  Past Medical History:  Diagnosis Date   ALLERGIC RHINITIS 04/07/2007   ASTHMA 04/07/2007   CONSTIPATION 10/29/2009   Diabetes mellitus type II, non insulin dependent ([place]) 03/08/2011   Improved w/ weight loss, A1c followed by primary care   GERD 04/07/2007   HEMATOCHEZIA 10/29/2009   HYPERTENSION 04/01/2007   LOW BACK PAIN 04/07/2007   Morbid obesity ([place]) 04/07/2007    Patient Active Problem List   Diagnosis Date Noted   Anxiety  06/01/2021   Vitamin D deficiency 06/01/2021   Cervical radiculopathy 07/23/2016   Thoracic back pain 07/23/2016   Chest pain 07/21/2016   Cough 10/25/2014   Increased prostate specific antigen (PSA) velocity 04/26/2014   Loss of weight 10/24/2012   Urinary frequency 10/24/2012   Edema 03/17/2011   Diabetes ([place]) 03/08/2011   Encounter for well adult exam with abnormal findings 03/08/2011   CONSTIPATION 10/29/2009   HEMATOCHEZIA 10/29/2009   Morbid obesity ([place]) 04/07/2007   ALLERGIC RHINITIS 04/07/2007   Asthma 04/07/2007   GERD 04/07/2007   Lumbar back pain 04/07/2007   Essential hypertension 04/01/2007    Past Surgical History:  Procedure Laterality Date   CHOLECYSTECTOMY  2014       Family History  Problem Relation Age of Onset   Stroke Other    Diabetes Mellitus II Father    Prostate cancer Father    Throat cancer Maternal Uncle    CAD Neg Hx     Social History   Tobacco Use   Smoking status: Former    Packs/day: 0.10    Types: Cigarettes   Smokeless tobacco: Never  Substance Use Topics   Alcohol use: Yes   Drug use: No    Home Medications Prior to Admission medications   Medication Sig Start Date End Date Taking? Authorizing Provider  albuterol (VENTOLIN HFA) 108 (90 Base) MCG/ACT inhaler Inhale 2 puffs into the lungs every 6 (six) hours as needed for wheezing or shortness of breath. 11/11/20   Biagio Borg, MD  aspirin EC 81  MG tablet Take 1 tablet (81 mg total) by mouth daily. 05/04/17   Biagio Borg, MD  gabapentin (NEURONTIN) 300 MG capsule Take 1 capsule (300 mg total) by mouth 3 (three) times daily as needed (nerve pain). 04/25/20   Gregor Hams, MD  glucose blood (ACCU-CHEK AVIVA PLUS) test strip Test blood sugar twice a day E11.9 03/12/20   Biagio Borg, MD  lisinopril (ZESTRIL) 40 MG tablet Take 1 tablet (40 mg total) by mouth daily. 11/11/20   Biagio Borg, MD  metFORMIN (GLUCOPHAGE-XR) 500 MG 24 hr tablet TAKE 3 TABLET BY MOUTH EVERY DAY WITH  BREAKFAST 06/11/21   Biagio Borg, MD    Allergies    Patient has no known allergies.  Review of Systems   Review of Systems  All other systems reviewed and are negative.  Physical Exam Updated Vital Signs BP (!) 155/93   Pulse 85   Temp 98.3 F (36.8 C) (Oral)   Resp 16   Ht 5\' 8"  (1.727 m)   Wt 118.8 kg   SpO2 95%   BMI 39.84 kg/m   Physical Exam Vitals and nursing note reviewed.  Constitutional:      General: He is not in acute distress.    Appearance: He is well-developed.  HENT:     Head: Atraumatic.     Mouth/Throat:     Mouth: Mucous membranes are moist.  Eyes:     Extraocular Movements: Extraocular movements intact.     Conjunctiva/sclera: Conjunctivae normal.     Pupils: Pupils are equal, round, and reactive to light.  Cardiovascular:     Rate and Rhythm: Normal rate.     Pulses: Normal pulses.     Heart sounds: Normal heart sounds. No murmur heard.   No friction rub. No gallop.  Pulmonary:     Effort: Pulmonary effort is normal.     Breath sounds: Normal breath sounds. No wheezing, rhonchi or rales.  Abdominal:     Palpations: Abdomen is soft.  Musculoskeletal:     Cervical back: Neck supple.     Right lower leg: No edema.     Left lower leg: No edema.  Skin:    Findings: No rash.  Neurological:     Mental Status: He is alert. Mental status is at baseline.  Psychiatric:        Mood and Affect: Mood normal.    ED Results / Procedures / Treatments   Labs (all labs ordered are listed, but only abnormal results are displayed) Labs Reviewed  BASIC METABOLIC PANEL - Abnormal; Notable for the following components:      Result Value   Glucose, Bld 118 (*)    All other components within normal limits  CBC WITH DIFFERENTIAL/PLATELET  TSH    EKG None ED ECG REPORT   Date: 06/12/2021  Rate: 83  Rhythm: normal sinus rhythm  QRS Axis: normal  Intervals: normal  ST/T Wave abnormalities: early repolarization  Conduction Disutrbances:none   Narrative Interpretation:   Old EKG Reviewed: none available  I have personally reviewed the EKG tracing and agree with the computerized printout as noted.   Radiology No results found.  Procedures Procedures   Medications Ordered in ED Medications - No data to display  ED Course  I have reviewed the triage vital signs and the nursing notes.  Pertinent labs & imaging results that were available during my care of the patient were reviewed by me and considered in my medical decision  making (see chart for details).    MDM Rules/Calculators/A&P                           BP (!) 155/93   Pulse 85   Temp 98.3 F (36.8 C) (Oral)   Resp 16   Ht 5\' 8"  (1.727 m)   Wt 118.8 kg   SpO2 95%   BMI 39.84 kg/m   Final Clinical Impression(s) / ED Diagnoses Final diagnoses:  Heart palpitations    Rx / DC Orders ED Discharge Orders     None      8:25 PM Patient endorsed recurrent palpitations lightheadedness which has been recurred for the past 3 weeks.  Patient overall well-appearing, vital signs stable, blood pressure is 155/93.  Work-up initiated.   9:43 PM Labs are reassuring, no evidence of electrolyte imbalance, no anemia, normal TSH, repeat blood pressure show normal limit.  EKG reviewed by me showing early repol pattern but otherwise no concerning arrhythmia.  Encourage pt to f/u with PCP or outpt cardiology for further assessment.  Pt may benefit from holter monitoring.  Return precaution given.    Domenic Moras, PA-C 06/12/21 2149    Daleen Bo, MD 06/13/21 [phone]

## 2021-06-12 NOTE — ED Provider Notes (Signed)
Emergency Medicine Provider Triage Evaluation Note  Jerome [name] , a 50 y.o. male  was evaluated in triage.  Pt complains of palpitations that have been present since march 2022. At that time his bp meds were increased and sxs improved. Sxs recurred over the last few weeks.  Review of Systems  Positive: palpitations Negative: Chest pain, shortness of breath  Physical Exam  BP (!) 155/93   Pulse 85   Temp 98.3 F (36.8 C) (Oral)   Resp 16   Ht 5\' 8"  (1.727 m)   Wt 118.8 kg   SpO2 95%   BMI 39.84 kg/m  Gen:   Awake, no distress   Resp:  Normal effort  MSK:   Moves extremities without difficulty  Other:  Heart with rrr, lungs ctab  Medical Decision Making  Medically screening exam initiated at 6:50 PM.  Appropriate orders placed.  Jerome [name] was informed that the remainder of the evaluation will be completed by another provider, this initial triage assessment does not replace that evaluation, and the importance of remaining in the ED until their evaluation is complete.     Jerome [name] 06/12/21 1852    Isla Pence, MD 06/12/21 1903

## 2021-06-12 NOTE — ED Notes (Signed)
An After Visit Summary was printed and given to the patient. Discharge instructions given and no further questions at this time.  

## 2021-06-12 NOTE — Discharge Instructions (Signed)
Please call and follow up with your doctor for further evaluation.   You may benefit from Holter Monitoring for further evaluation of heart palpitation if deemed appropriate by your provider.

## 2021-06-12 NOTE — ED Triage Notes (Addendum)
Pt states he has been having HTN- 154/90. Pt states yesterday 120/80. Pt concerned as it's all "over the place". Pt takes lisinopril, took it at 1500. Denies headache, dizziness.

## 2021-06-26 ENCOUNTER — Encounter: Payer: BC Managed Care – PPO | Admitting: Internal Medicine

## 2021-06-27 ENCOUNTER — Telehealth: Payer: Self-pay | Admitting: Internal Medicine

## 2021-06-27 ENCOUNTER — Other Ambulatory Visit: Payer: Self-pay

## 2021-06-27 ENCOUNTER — Ambulatory Visit (INDEPENDENT_AMBULATORY_CARE_PROVIDER_SITE_OTHER)
Admission: RE | Admit: 2021-06-27 | Discharge: 2021-06-27 | Disposition: A | Discharge status: Home / Self Care | Payer: Self-pay | Source: Ambulatory Visit | Attending: Internal Medicine | Admitting: Internal Medicine

## 2021-06-27 ENCOUNTER — Encounter: Payer: Self-pay | Admitting: Internal Medicine

## 2021-06-27 DIAGNOSIS — I10 Essential (primary) hypertension: Secondary | ICD-10-CM

## 2021-06-27 NOTE — Telephone Encounter (Signed)
Spoke with patient and advised that provider has not reviewed results yet and once he does, we will contact him

## 2021-06-27 NOTE — Telephone Encounter (Signed)
Patient notified that provider still working on forms. Will give patient a call Monday when FMLA form is ready

## 2021-06-27 NOTE — Telephone Encounter (Signed)
Patient checking status of fmla forms  Please call patient when forms are ready for pick up

## 2021-06-27 NOTE — Telephone Encounter (Signed)
Pt received results for Cardiac CT Score. States that he is confused and would like a call back to discuss.  Please advise.   Callback #- [phone]

## 2021-06-29 ENCOUNTER — Telehealth: Payer: Self-pay | Admitting: Internal Medicine

## 2021-06-29 NOTE — Telephone Encounter (Signed)
Ok to contact pt  Please let him know I have reivewed his form form which is an FMLA form  I am confused however how to fill this out as there are many ways to do this, and I dont see where he would need this at all;  most persons need this for a short continous period of time for an temporary acute illness that I dont see that he has had recently;   or they might need this for symptoms such as asthma flare or similar but this was not discussed at his last visit  So I dont think I can fill out the FMLA not knowing what he is needing it for

## 2021-06-30 NOTE — Telephone Encounter (Signed)
Left message for patient to call me back. 

## 2021-07-25 ENCOUNTER — Encounter: Payer: Self-pay | Admitting: Internal Medicine

## 2021-07-25 ENCOUNTER — Ambulatory Visit (INDEPENDENT_AMBULATORY_CARE_PROVIDER_SITE_OTHER): Payer: BC Managed Care – PPO | Admitting: Internal Medicine

## 2021-07-25 ENCOUNTER — Other Ambulatory Visit: Payer: Self-pay

## 2021-07-25 VITALS — BP 152/80 | HR 73 | Ht 68.0 in | Wt 262.0 lb

## 2021-07-25 DIAGNOSIS — R002 Palpitations: Secondary | ICD-10-CM

## 2021-07-25 DIAGNOSIS — J4520 Mild intermittent asthma, uncomplicated: Secondary | ICD-10-CM

## 2021-07-25 DIAGNOSIS — I10 Essential (primary) hypertension: Secondary | ICD-10-CM | POA: Diagnosis not present

## 2021-07-25 DIAGNOSIS — E1165 Type 2 diabetes mellitus with hyperglycemia: Secondary | ICD-10-CM

## 2021-07-25 DIAGNOSIS — E559 Vitamin D deficiency, unspecified: Secondary | ICD-10-CM

## 2021-07-25 DIAGNOSIS — F419 Anxiety disorder, unspecified: Secondary | ICD-10-CM

## 2021-07-25 NOTE — Assessment & Plan Note (Signed)
Last vitamin D Lab Results  Component Value Date   VD25OH 15.94 (L) 05/29/2021   Low, to start oral replacement

## 2021-07-25 NOTE — Patient Instructions (Addendum)
We will fill out the form over the weekend  Please check your BP at home daily for 10 days and let us know the average, but also keep working on diet and weight control as well  Please continue all other medications as before, and refills have been done if requested.  Please have the pharmacy call with any other refills you may need.  Please continue your efforts at being more active, low cholesterol diet, and weight control.  Please keep your appointments with your specialists as you may have planned  Please make an Appointment to return in 6 months, or sooner if needed, if you dont already have another appt

## 2021-07-25 NOTE — Progress Notes (Signed)
Patient ID: Jerome [name], male   DOB: 08/18/1970, 50 y.o.   MRN: [id number]        Chief Complaint: follow up HTN, HLD and hyperglycemia, and palpitations       HPI:  Jerome [name] is a 50 y.o. male here overall doing ok, asking for FMLA as missed work policy is very strict at possible loss of job per pt with 4 missed days over 6 mo period; has hx of palpitations, htn, asthma, dm, anxiety.  Now taking 5000 u Vit d, and palps seemed to be resolved or now.  Not checking BP at home though he can, just not lately.  Only taking 2 metformin per day at this time as CBGs seem to be in lower 100s and fearful of low sugars.  Pt denies chest pain, increased sob or doe, wheezing, orthopnea, PND, increased LE swelling, palpitations, dizziness or syncope.   Pt denies polydipsia, polyuria, or new focal neuro s/s.   Pt denies fever, wt loss, night sweats, loss of appetite, or other constitutional symptoms  No other new complaints.  Denies worsening depressive symptoms, suicidal ideation, or panic   not taking vitamin D Wt Readings from Last 3 Encounters:  07/25/21 262 lb (118.8 kg)  06/12/21 262 lb (118.8 kg)  05/30/21 262 lb (118.8 kg)   BP Readings from Last 3 Encounters:  07/25/21 (!) 152/80  06/12/21 (!) 149/89  05/30/21 136/76         Past Medical History:  Diagnosis Date   ALLERGIC RHINITIS 04/07/2007   ASTHMA 04/07/2007   CONSTIPATION 10/29/2009   Diabetes mellitus type II, non insulin dependent ([place]) 03/08/2011   Improved w/ weight loss, A1c followed by primary care   GERD 04/07/2007   HEMATOCHEZIA 10/29/2009   HYPERTENSION 04/01/2007   LOW BACK PAIN 04/07/2007   Morbid obesity ([place]) 04/07/2007   Past Surgical History:  Procedure Laterality Date   CHOLECYSTECTOMY  2014    reports that he has quit smoking. His smoking use included cigarettes. He smoked an average of .1 packs per day. He has never used smokeless tobacco. He reports current alcohol use. He reports that he does not use drugs. family  history includes Diabetes Mellitus II in his father; Prostate cancer in his father; Stroke in an other family member; Throat cancer in his maternal uncle. No Known Allergies Current Outpatient Medications on File Prior to Visit  Medication Sig Dispense Refill   albuterol (VENTOLIN HFA) 108 (90 Base) MCG/ACT inhaler Inhale 2 puffs into the lungs every 6 (six) hours as needed for wheezing or shortness of breath. 8 g 11   aspirin EC 81 MG tablet Take 1 tablet (81 mg total) by mouth daily. 90 tablet 3   gabapentin (NEURONTIN) 300 MG capsule Take 1 capsule (300 mg total) by mouth 3 (three) times daily as needed (nerve pain). 90 capsule 3   glucose blood (ACCU-CHEK AVIVA PLUS) test strip Test blood sugar twice a day E11.9 200 each 11   lisinopril (ZESTRIL) 40 MG tablet Take 1 tablet (40 mg total) by mouth daily. 90 tablet 3   No current facility-administered medications on file prior to visit.        ROS:  All others reviewed and negative.  Objective        PE:  BP (!) 152/80 (BP Location: Left Arm, Patient Position: Sitting, Cuff Size: Large)   Pulse 73   Ht 5\' 8"  (1.727 m)   Wt 262 lb (118.8 kg)   SpO2 99%  BMI 39.84 kg/m                 Constitutional: Pt appears in NAD               HENT: Head: NCAT.                Right Ear: External ear normal.                 Left Ear: External ear normal.                Eyes: . Pupils are equal, round, and reactive to light. Conjunctivae and EOM are normal               Nose: without d/c or deformity               Neck: Neck supple. Gross normal ROM               Cardiovascular: Normal rate and regular rhythm.                 Pulmonary/Chest: Effort normal and breath sounds without rales or wheezing.                Abd:  Soft, NT, ND, + BS, no organomegaly               Neurological: Pt is alert. At baseline orientation, motor grossly intact               Skin: Skin is warm. No rashes, no other new lesions, LE edema - none                Psychiatric: Pt behavior is normal without agitation , mild nervous  Micro: none  Cardiac tracings I have personally interpreted today:  none  Pertinent Radiological findings (summarize): none   Lab Results  Component Value Date   WBC 6.0 06/12/2021   HGB 14.8 06/12/2021   HCT 44.7 06/12/2021   PLT 297 06/12/2021   GLUCOSE 118 (H) 06/12/2021   CHOL 115 05/29/2021   TRIG 121.0 05/29/2021   HDL 40.10 05/29/2021   LDLCALC 51 05/29/2021   ALT 34 05/29/2021   AST 23 05/29/2021   NA 138 06/12/2021   K 4.1 06/12/2021   CL 107 06/12/2021   CREATININE 1.05 06/12/2021   BUN 15 06/12/2021   CO2 23 06/12/2021   TSH 1.095 06/12/2021   PSA 1.33 05/29/2021   HGBA1C 7.0 (H) 05/29/2021   MICROALBUR <0.7 05/29/2021   Assessment/Plan:  Jerome [name] is a 50 y.o. Black or African American [2] male with  has a past medical history of ALLERGIC RHINITIS (04/07/2007), ASTHMA (04/07/2007), CONSTIPATION (10/29/2009), Diabetes mellitus type II, non insulin dependent ([place]) (03/08/2011), GERD (04/07/2007), HEMATOCHEZIA (10/29/2009), HYPERTENSION (04/01/2007), LOW BACK PAIN (04/07/2007), and Morbid obesity ([place]) (04/07/2007).  Vitamin D deficiency Last vitamin D Lab Results  Component Value Date   VD25OH 15.94 (L) 05/29/2021   Low, to start oral replacement   Essential hypertension BP Readings from Last 3 Encounters:  07/25/21 (!) 152/80  06/12/21 (!) 149/89  05/30/21 136/76   Uncontrolled here, pt decliens med change for now,, pt to continue medical treatment lisinopril 40; states will make concerted effort at lifestyle changes, get wt down and therefore bp and sugar as well.     Asthma Currently stable,, cont albuterol hfa prn,  to f/u any worsening symptoms or concerns  Diabetes [facility]) Lab Results  Component Value Date   HGBA1C  7.0 (H) 05/29/2021   Mild uncontrolled, pt somewhat noncompliant with recommended tx only taking 2 metformin (1000 mg) instead of 1500 mgl  pt to continue current  medical treatment as declines change and will make concerted effort at wt management   Palpitations Etiology unclear, declines ECG, of add BB today as seems to have resolved with vit d per pt,  to f/u any worsening symptoms or concerns as declines any further eval or tx at this time  Anxiety I suspect at least mild to mod chronic persistent with limited insight; declines further tx at this time,  to f/u any worsening symptoms or concerns  Followup: Return in about 6 months (around 01/23/2022).  [name], MD 07/26/2021 11:24 AM [facility]

## 2021-07-26 ENCOUNTER — Encounter: Payer: Self-pay | Admitting: Internal Medicine

## 2021-07-26 DIAGNOSIS — R002 Palpitations: Secondary | ICD-10-CM | POA: Insufficient documentation

## 2021-07-26 MED ORDER — METFORMIN HCL ER 500 MG PO TB24: ORAL_TABLET | ORAL | 3 refills | Status: DC

## 2021-07-26 NOTE — Assessment & Plan Note (Signed)
Lab Results  Component Value Date   HGBA1C 7.0 (H) 05/29/2021   Mild uncontrolled, pt somewhat noncompliant with recommended tx only taking 2 metformin (1000 mg) instead of 1500 mgl  pt to continue current medical treatment as declines change and will make concerted effort at wt management

## 2021-07-26 NOTE — Assessment & Plan Note (Signed)
BP Readings from Last 3 Encounters:  07/25/21 (!) 152/80  06/12/21 (!) 149/89  05/30/21 136/76   Uncontrolled here, pt decliens med change for now,, pt to continue medical treatment lisinopril 40; states will make concerted effort at lifestyle changes, get wt down and therefore bp and sugar as well.

## 2021-07-26 NOTE — Assessment & Plan Note (Signed)
Currently stable,, cont albuterol hfa prn,  to f/u any worsening symptoms or concerns

## 2021-07-26 NOTE — Assessment & Plan Note (Signed)
I suspect at least mild to mod chronic persistent with limited insight; declines further tx at this time,  to f/u any worsening symptoms or concerns

## 2021-07-26 NOTE — Assessment & Plan Note (Signed)
Etiology unclear, declines ECG, of add BB today as seems to have resolved with vit d per pt,  to f/u any worsening symptoms or concerns as declines any further eval or tx at this time

## 2021-07-28 ENCOUNTER — Encounter: Payer: Self-pay | Admitting: Internal Medicine

## 2021-07-28 DIAGNOSIS — N200 Calculus of kidney: Secondary | ICD-10-CM | POA: Insufficient documentation

## 2021-08-14 DIAGNOSIS — E119 Type 2 diabetes mellitus without complications: Secondary | ICD-10-CM | POA: Diagnosis not present

## 2021-08-15 ENCOUNTER — Telehealth: Payer: Self-pay | Admitting: Internal Medicine

## 2021-08-15 MED ORDER — METOPROLOL SUCCINATE ER 50 MG PO TB24: 50.0000 mg | ORAL_TABLET | Freq: Every day | ORAL | 3 refills | Status: DC

## 2021-08-15 NOTE — Telephone Encounter (Signed)
Ok to add toprol xl 50 mg per day - done erx   After 5 days of taking it, please check BP as before and let us know average again  thanks

## 2021-08-15 NOTE — Telephone Encounter (Signed)
Patient has been notified

## 2021-08-15 NOTE — Telephone Encounter (Signed)
Patient states provider recommended him checking his bp at home daily for 10 days starting 07-25-2021  Patient states his average bp reading is 150/90  Patient states he is still exercising but is unable to get bp controlled  Patient states provider recommended beta blockers at last ov, patient states he would like to request that prescription   Please advise

## 2021-11-11 ENCOUNTER — Telehealth: Payer: Self-pay | Admitting: Internal Medicine

## 2021-11-11 NOTE — Telephone Encounter (Signed)
1.Medication Requested: lisinopril (ZESTRIL) 40 MG tablet ? ?2. Pharmacy (Name[facility]): [facility] [facility] [place] ? ?3. On Med List: Y ? ?4. Last Visit with PCP: 07-25-2021 ? ?5. Next visit date with PCP: 01-16-2022 ? ? ?Agent: Please be advised that RX refills may take up to 3 business days. We ask that you follow-up with your pharmacy.  ?

## 2021-11-12 MED ORDER — LISINOPRIL 40 MG PO TABS: 40.0000 mg | ORAL_TABLET | Freq: Every day | ORAL | 1 refills | Status: DC

## 2021-11-12 NOTE — Telephone Encounter (Signed)
Rx sent to pharmacy   

## 2022-01-16 ENCOUNTER — Ambulatory Visit: Payer: BC Managed Care – PPO | Admitting: Internal Medicine

## 2022-01-23 ENCOUNTER — Encounter: Payer: Self-pay | Admitting: Internal Medicine

## 2022-01-23 ENCOUNTER — Ambulatory Visit (INDEPENDENT_AMBULATORY_CARE_PROVIDER_SITE_OTHER): Payer: BC Managed Care – PPO | Admitting: Internal Medicine

## 2022-01-23 ENCOUNTER — Other Ambulatory Visit: Payer: Self-pay | Admitting: Internal Medicine

## 2022-01-23 VITALS — BP 124/66 | HR 54 | Temp 98.1°F | Ht 68.0 in | Wt 255.0 lb

## 2022-01-23 DIAGNOSIS — I10 Essential (primary) hypertension: Secondary | ICD-10-CM | POA: Diagnosis not present

## 2022-01-23 DIAGNOSIS — E1165 Type 2 diabetes mellitus with hyperglycemia: Secondary | ICD-10-CM

## 2022-01-23 DIAGNOSIS — E538 Deficiency of other specified B group vitamins: Secondary | ICD-10-CM | POA: Diagnosis not present

## 2022-01-23 DIAGNOSIS — E559 Vitamin D deficiency, unspecified: Secondary | ICD-10-CM | POA: Diagnosis not present

## 2022-01-23 DIAGNOSIS — Z125 Encounter for screening for malignant neoplasm of prostate: Secondary | ICD-10-CM | POA: Diagnosis not present

## 2022-01-23 DIAGNOSIS — J4520 Mild intermittent asthma, uncomplicated: Secondary | ICD-10-CM

## 2022-01-23 DIAGNOSIS — Z0001 Encounter for general adult medical examination with abnormal findings: Secondary | ICD-10-CM | POA: Diagnosis not present

## 2022-01-23 LAB — CBC WITH DIFFERENTIAL/PLATELET
Basophils Absolute: 0 10*3/uL (ref 0.0–0.1)
Basophils Relative: 0.5 % (ref 0.0–3.0)
Eosinophils Absolute: 0.2 10*3/uL (ref 0.0–0.7)
Eosinophils Relative: 3.4 % (ref 0.0–5.0)
HCT: 43.5 % (ref 39.0–52.0)
Hemoglobin: 14.2 g/dL (ref 13.0–17.0)
Lymphocytes Relative: 43.7 % (ref 12.0–46.0)
Lymphs Abs: 2.9 10*3/uL (ref 0.7–4.0)
MCHC: 32.6 g/dL (ref 30.0–36.0)
MCV: 89.3 fl (ref 78.0–100.0)
Monocytes Absolute: 0.6 10*3/uL (ref 0.1–1.0)
Monocytes Relative: 9 % (ref 3.0–12.0)
Neutro Abs: 2.9 10*3/uL (ref 1.4–7.7)
Neutrophils Relative %: 43.4 % (ref 43.0–77.0)
Platelets: 265 10*3/uL (ref 150.0–400.0)
RBC: 4.87 Mil/uL (ref 4.22–5.81)
RDW: 12.4 % (ref 11.5–15.5)
WBC: 6.6 10*3/uL (ref 4.0–10.5)

## 2022-01-23 LAB — URINALYSIS, ROUTINE W REFLEX MICROSCOPIC
Bilirubin Urine: NEGATIVE
Hgb urine dipstick: NEGATIVE
Ketones, ur: NEGATIVE
Leukocytes,Ua: NEGATIVE
Nitrite: NEGATIVE
RBC / HPF: NONE SEEN (ref 0–?)
Specific Gravity, Urine: 1.02 (ref 1.000–1.030)
Total Protein, Urine: NEGATIVE
Urine Glucose: NEGATIVE
Urobilinogen, UA: 0.2 (ref 0.0–1.0)
pH: 5.5 (ref 5.0–8.0)

## 2022-01-23 LAB — LIPID PANEL
Cholesterol: 127 mg/dL (ref 0–200)
HDL: 41.8 mg/dL (ref >39.00)
LDL Cholesterol: 56 mg/dL (ref 0–99)
NonHDL: 85.56
Total CHOL/HDL Ratio: 3
Triglycerides: 148 mg/dL (ref 0.0–149.0)
VLDL: 29.6 mg/dL (ref 0.0–40.0)

## 2022-01-23 LAB — BASIC METABOLIC PANEL
BUN: 22 mg/dL (ref 6–23)
CO2: 24 mEq/L (ref 19–32)
Calcium: 9.6 mg/dL (ref 8.4–10.5)
Chloride: 103 mEq/L (ref 96–112)
Creatinine, Ser: 1.28 mg/dL (ref 0.40–1.50)
GFR: 64.69 mL/min (ref >60.00)
Glucose, Bld: 157 mg/dL — ABNORMAL HIGH (ref 70–99)
Potassium: 4.2 mEq/L (ref 3.5–5.1)
Sodium: 136 mEq/L (ref 135–145)

## 2022-01-23 LAB — HEPATIC FUNCTION PANEL
ALT: 23 U/L (ref 0–53)
AST: 19 U/L (ref 0–37)
Albumin: 4.3 g/dL (ref 3.5–5.2)
Alkaline Phosphatase: 54 U/L (ref 39–117)
Bilirubin, Direct: 0.1 mg/dL (ref 0.0–0.3)
Total Bilirubin: 0.6 mg/dL (ref 0.2–1.2)
Total Protein: 7.1 g/dL (ref 6.0–8.3)

## 2022-01-23 LAB — VITAMIN B12: Vitamin B-12: >1504 pg/mL — ABNORMAL HIGH (ref 211–911)

## 2022-01-23 LAB — TSH: TSH: 2.36 u[IU]/mL (ref 0.35–5.50)

## 2022-01-23 LAB — MICROALBUMIN / CREATININE URINE RATIO
Creatinine,U: 108.3 mg/dL
Microalb Creat Ratio: 1.2 mg/g (ref 0.0–30.0)
Microalb, Ur: 1.4 mg/dL (ref 0.0–1.9)

## 2022-01-23 LAB — VITAMIN D 25 HYDROXY (VIT D DEFICIENCY, FRACTURES): VITD: 46.01 ng/mL (ref 30.00–100.00)

## 2022-01-23 LAB — PSA: PSA: 1.39 ng/mL (ref 0.10–4.00)

## 2022-01-23 LAB — HEMOGLOBIN A1C: Hgb A1c MFr Bld: 5.7 % (ref 4.6–6.5)

## 2022-01-23 MED ORDER — METFORMIN HCL ER 500 MG PO TB24: ORAL_TABLET | ORAL | 3 refills | Status: DC

## 2022-01-23 NOTE — Progress Notes (Unsigned)
Patient ID: Jerome [name], male   DOB: [birth year], 51 y.o.   MRN: [id number]         Chief Complaint:: wellness exam and low vit d, htn, dm, asthma       HPI:  Jerome [name] is a 51 y.o. male here for wellness exam; declines shiingrix, colonoscopy, o/w up to date                        Also Pt denies chest pain, increased sob or doe, wheezing, orthopnea, PND, increased LE swelling, palpitations, dizziness or syncope.   Pt denies polydipsia, polyuria, or new focal neuro s/s.    Pt denies fever, wt loss, night sweats, loss of appetite, or other constitutional symptoms  Going to gym 5 times per wk. Does not want new meds such as ozempic.  Peak wt has been about 305.  Really trying to lose wt the natural way and get off most rx meds. Not taking Vit D.   Wt Readings from Last 3 Encounters:  01/23/22 255 lb (115.7 kg)  07/25/21 262 lb (118.8 kg)  06/12/21 262 lb (118.8 kg)   BP Readings from Last 3 Encounters:  01/23/22 124/66  07/25/21 (!) 152/80  06/12/21 (!) 149/89   Immunization History  Administered Date(s) Administered   Influenza Whole 08/17/2001   PFIZER(Purple Top)SARS-COV-2 Vaccination 03/22/2020, 04/21/2020   Td 08/17/1998, 09/06/2008   Tdap 01/05/2019   There are no preventive care reminders to display for this patient.     Past Medical History:  Diagnosis Date   ALLERGIC RHINITIS 04/07/2007   ASTHMA 04/07/2007   CONSTIPATION 10/29/2009   Diabetes mellitus type II, non insulin dependent ([place]) 03/08/2011   Improved w/ weight loss, A1c followed by primary care   GERD 04/07/2007   HEMATOCHEZIA 10/29/2009   HYPERTENSION 04/01/2007   LOW BACK PAIN 04/07/2007   Morbid obesity ([place]) 04/07/2007   Past Surgical History:  Procedure Laterality Date   CHOLECYSTECTOMY  2014    reports that he has quit smoking. His smoking use included cigarettes. He smoked an average of .1 packs per day. He has never used smokeless tobacco. He reports current alcohol use. He reports that he does not  use drugs. family history includes Diabetes Mellitus II in his father; Prostate cancer in his father; Stroke in an other family member; Throat cancer in his maternal uncle. No Known Allergies Current Outpatient Medications on File Prior to Visit  Medication Sig Dispense Refill   aspirin EC 81 MG tablet Take 1 tablet (81 mg total) by mouth daily. 90 tablet 3   gabapentin (NEURONTIN) 300 MG capsule Take 1 capsule (300 mg total) by mouth 3 (three) times daily as needed (nerve pain). 90 capsule 3   glucose blood (ACCU-CHEK AVIVA PLUS) test strip Test blood sugar twice a day E11.9 200 each 11   No current facility-administered medications on file prior to visit.        ROS:  All others reviewed and negative.  Objective        PE:  BP 124/66 (BP Location: Right Arm, Patient Position: Sitting, Cuff Size: Large)   Pulse (!) 54   Temp 98.1 F (36.7 C) (Oral)   Ht '5\' 8"'$  (1.727 m)   Wt 255 lb (115.7 kg)   SpO2 99%   BMI 38.77 kg/m                 Constitutional: Pt appears in NAD  HENT: Head: NCAT.                Right Ear: External ear normal.                 Left Ear: External ear normal.                Eyes: . Pupils are equal, round, and reactive to light. Conjunctivae and EOM are normal               Nose: without d/c or deformity               Neck: Neck supple. Gross normal ROM               Cardiovascular: Normal rate and regular rhythm.                 Pulmonary/Chest: Effort normal and breath sounds without rales or wheezing.                Abd:  Soft, NT, ND, + BS, no organomegaly               Neurological: Pt is alert. At baseline orientation, motor grossly intact               Skin: Skin is warm. No rashes, no other new lesions, LE edema - none               Psychiatric: Pt behavior is normal without agitation   Micro: none  Cardiac tracings I have personally interpreted today:  none  Pertinent Radiological findings (summarize): none   Lab Results   Component Value Date   WBC 6.6 01/23/2022   HGB 14.2 01/23/2022   HCT 43.5 01/23/2022   PLT 265.0 01/23/2022   GLUCOSE 157 (H) 01/23/2022   CHOL 127 01/23/2022   TRIG 148.0 01/23/2022   HDL 41.80 01/23/2022   LDLCALC 56 01/23/2022   ALT 23 01/23/2022   AST 19 01/23/2022   NA 136 01/23/2022   K 4.2 01/23/2022   CL 103 01/23/2022   CREATININE 1.28 01/23/2022   BUN 22 01/23/2022   CO2 24 01/23/2022   TSH 2.36 01/23/2022   PSA 1.39 01/23/2022   HGBA1C 5.7 01/23/2022   MICROALBUR 1.4 01/23/2022   Assessment/Plan:  Jerome [name] is a 51 y.o. Black or African American [2] male with  has a past medical history of ALLERGIC RHINITIS (04/07/2007), ASTHMA (04/07/2007), CONSTIPATION (10/29/2009), Diabetes mellitus type II, non insulin dependent ([place]) (03/08/2011), GERD (04/07/2007), HEMATOCHEZIA (10/29/2009), HYPERTENSION (04/01/2007), LOW BACK PAIN (04/07/2007), and Morbid obesity ([place]) (04/07/2007).  Vitamin D deficiency Last vitamin D Lab Results  Component Value Date   VD25OH 15.94 (L) 05/29/2021   Low, to start oral replacement   Encounter for well adult exam with abnormal findings Age and sex appropriate education and counseling updated with regular exercise and diet Referrals for preventative services - declines colonoscopy Immunizations addressed - decliens shingrix Smoking counseling  - none needed Evidence for depression or other mood disorder - none significant Most recent labs reviewed. I have personally reviewed and have noted: 1) the patient's medical and social history 2) The patient's current medications and supplements 3) The patient's height, weight, and BMI have been recorded in the chart   Essential hypertension BP Readings from Last 3 Encounters:  01/23/22 124/66  07/25/21 (!) 152/80  06/12/21 (!) 149/89   Stable, pt to continue medical treatment lisinopril 40 mg qd, toprol xl 50 mg qd  Diabetes ([place]) Lab Results  Component Value Date   HGBA1C 5.7  01/23/2022   Stable, pt to continue current medical treatment metformin ER 500 mg - 1 qd   Asthma Stable, continue albuterol hfa prn  Followup: Return in about 6 months (around 07/25/2022).  [name], MD 01/25/2022 12:19 PM [facility]

## 2022-01-23 NOTE — Assessment & Plan Note (Signed)
Last vitamin D Lab Results  Component Value Date   VD25OH 15.94 (L) 05/29/2021   Low, to start oral replacement

## 2022-01-23 NOTE — Patient Instructions (Addendum)
Please consider the shingles shot at your pharmacy  Please continue all other medications as before, and refills have been done if requested.  Please have the pharmacy call with any other refills you may need.  Please continue your efforts at being more active, low cholesterol diet, and weight control.  You are otherwise up to date with prevention measures today.  Please keep your appointments with your specialists as you may have planned  Please go to the LAB at the blood drawing area for the tests to be done  You will be contacted by phone if any changes need to be made immediately.  Otherwise, you will receive a letter about your results with an explanation, but please check with MyChart first.  Please remember to sign up for MyChart if you have not done so, as this will be important to you in the future with finding out test results, communicating by private email, and scheduling acute appointments online when needed.  Please make an Appointment to return in 6 months, or sooner if needed

## 2022-01-25 ENCOUNTER — Encounter: Payer: Self-pay | Admitting: Internal Medicine

## 2022-01-25 MED ORDER — METOPROLOL SUCCINATE ER 50 MG PO TB24: 50.0000 mg | ORAL_TABLET | Freq: Every day | ORAL | 3 refills | Status: DC

## 2022-01-25 MED ORDER — ALBUTEROL SULFATE HFA 108 (90 BASE) MCG/ACT IN AERS: 2.0000 | INHALATION_SPRAY | Freq: Four times a day (QID) | RESPIRATORY_TRACT | 11 refills | Status: DC | PRN

## 2022-01-25 MED ORDER — LISINOPRIL 40 MG PO TABS: 40.0000 mg | ORAL_TABLET | Freq: Every day | ORAL | 3 refills | Status: DC

## 2022-01-25 MED ORDER — METFORMIN HCL ER 500 MG PO TB24: ORAL_TABLET | ORAL | 3 refills | Status: DC

## 2022-01-25 NOTE — Assessment & Plan Note (Signed)
Stable, continue albuterol hfa prn

## 2022-01-25 NOTE — Assessment & Plan Note (Signed)
BP Readings from Last 3 Encounters:  01/23/22 124/66  07/25/21 (!) 152/80  06/12/21 (!) 149/89   Stable, pt to continue medical treatment lisinopril 40 mg qd, toprol xl 50 mg qd

## 2022-01-25 NOTE — Assessment & Plan Note (Signed)
Lab Results  Component Value Date   HGBA1C 5.7 01/23/2022   Stable, pt to continue current medical treatment metformin ER 500 mg - 1 qd

## 2022-01-25 NOTE — Assessment & Plan Note (Signed)
Age and sex appropriate education and counseling updated with regular exercise and diet Referrals for preventative services - declines colonoscopy Immunizations addressed - decliens shingrix Smoking counseling  - none needed Evidence for depression or other mood disorder - none significant Most recent labs reviewed. I have personally reviewed and have noted: 1) the patient's medical and social history 2) The patient's current medications and supplements 3) The patient's height, weight, and BMI have been recorded in the chart

## 2022-01-26 ENCOUNTER — Telehealth: Payer: Self-pay | Admitting: Internal Medicine

## 2022-01-26 NOTE — Telephone Encounter (Signed)
Patient would like someone to call him with his latest lab results - he is concerned with the results he received

## 2022-04-28 ENCOUNTER — Telehealth: Payer: Self-pay

## 2022-04-28 MED ORDER — METFORMIN HCL ER 500 MG PO TB24: ORAL_TABLET | ORAL | 3 refills | Status: DC

## 2022-04-28 NOTE — Telephone Encounter (Signed)
Ok this is done 

## 2022-04-28 NOTE — Telephone Encounter (Signed)
Pt is requesting that the  metFORMIN (GLUCOPHAGE-XR) 500 MG 24 hr tablet Be increase to 2 TABS daily because now his sugars are out of control again.  Blood sugars today were:  5am 200 Last dose of medication 10pm 12Noon 187 With medication taken at 5am  Also pt is requesting a refill: metFORMIN (GLUCOPHAGE-XR) 500 MG 24 hr tablet [facility]: [facility]  If increased approved the refill should reflect the new amount of medication.   LOV 01/23/22 ROV 07/29/22

## 2022-05-01 ENCOUNTER — Telehealth: Payer: Self-pay | Admitting: Internal Medicine

## 2022-05-01 NOTE — Telephone Encounter (Signed)
Patient needs his accu check aviva plus test strips sent in - Please send to [facility] on [address].

## 2022-05-04 MED ORDER — ACCU-CHEK AVIVA PLUS VI STRP: ORAL_STRIP | 11 refills | Status: DC

## 2022-05-04 NOTE — Telephone Encounter (Signed)
Refill request sent to pharmacy, left message for a call back from patient.

## 2022-05-04 NOTE — Telephone Encounter (Signed)
I advised the pt that the medication refill was sent in to requested pharmacy.  FYI

## 2022-05-05 ENCOUNTER — Ambulatory Visit (INDEPENDENT_AMBULATORY_CARE_PROVIDER_SITE_OTHER): Payer: BC Managed Care – PPO | Admitting: Internal Medicine

## 2022-05-05 ENCOUNTER — Encounter: Payer: Self-pay | Admitting: Internal Medicine

## 2022-05-05 VITALS — BP 166/90 | HR 100 | Temp 97.9°F | Ht 68.0 in | Wt 257.0 lb

## 2022-05-05 DIAGNOSIS — E559 Vitamin D deficiency, unspecified: Secondary | ICD-10-CM | POA: Diagnosis not present

## 2022-05-05 DIAGNOSIS — I10 Essential (primary) hypertension: Secondary | ICD-10-CM | POA: Diagnosis not present

## 2022-05-05 DIAGNOSIS — E1165 Type 2 diabetes mellitus with hyperglycemia: Secondary | ICD-10-CM | POA: Diagnosis not present

## 2022-05-05 MED ORDER — DAPAGLIFLOZIN PROPANEDIOL 10 MG PO TABS: 10.0000 mg | ORAL_TABLET | Freq: Every day | ORAL | 3 refills | Status: DC

## 2022-05-05 MED ORDER — AMLODIPINE BESYLATE 10 MG PO TABS: 10.0000 mg | ORAL_TABLET | Freq: Every day | ORAL | 3 refills | Status: DC

## 2022-05-05 NOTE — Assessment & Plan Note (Signed)
Last vitamin D Lab Results  Component Value Date   VD25OH 46.01 01/23/2022   Stable, cont oral replacement

## 2022-05-05 NOTE — Patient Instructions (Addendum)
  Please take all new medication as prescribed - the amlodipine 10 mg per day for blood pressure  Please take all new medication as prescribed - the farxiga 10 mg per day  If you are able to get the farxiga, then ok to stop the metformin  Please continue all other medications as before, and refills have been done if requested.  Please have the pharmacy call with any other refills you may need.  Please keep your appointments with your specialists as you may have planned  Please make an Appointment to return in Jul 29, 2022, or sooner if needed

## 2022-05-05 NOTE — Progress Notes (Signed)
Patient ID: Jerome [name], male   DOB: 03-18-1971, 51 y.o.   MRN: [id number]        Chief Complaint: follow up HTN, HLD and hyperglycemia        HPI:  Jerome [name] is a 50 y.o. male here overall doing ok, but BP has been mildly increased over the last 2 wks, for no apparent reason, as wt has been overalls table.   Pt denies fever, wt loss, night sweats, loss of appetite, or other constitutional symptoms  Does not feel ill.  Pt denies chest pain, increased sob or doe, wheezing, orthopnea, PND, increased LE swelling, palpitations, dizziness or syncope.   Pt denies polydipsia, polyuria, or new focal neuro s/s.  Has 2 job offers, need better BP to get the job   [facility] Readings from Last 3 Encounters:  05/05/22 257 lb (116.6 kg)  01/23/22 255 lb (115.7 kg)  07/25/21 262 lb (118.8 kg)   BP Readings from Last 3 Encounters:  05/05/22 (!) 166/90  01/23/22 124/66  07/25/21 (!) 152/80         Past Medical History:  Diagnosis Date   ALLERGIC RHINITIS 04/07/2007   ASTHMA 04/07/2007   CONSTIPATION 10/29/2009   Diabetes mellitus type II, non insulin dependent ([place]) 03/08/2011   Improved w/ weight loss, A1c followed by primary care   GERD 04/07/2007   HEMATOCHEZIA 10/29/2009   HYPERTENSION 04/01/2007   LOW BACK PAIN 04/07/2007   Morbid obesity ([place]) 04/07/2007   Past Surgical History:  Procedure Laterality Date   CHOLECYSTECTOMY  2014    reports that he has quit smoking. His smoking use included cigarettes. He smoked an average of .1 packs per day. He has never used smokeless tobacco. He reports current alcohol use. He reports that he does not use drugs. family history includes Diabetes Mellitus II in his father; Prostate cancer in his father; Stroke in an other family member; Throat cancer in his maternal uncle. No Known Allergies Current Outpatient Medications on File Prior to Visit  Medication Sig Dispense Refill   albuterol (VENTOLIN HFA) 108 (90 Base) MCG/ACT inhaler Inhale 2 puffs into the lungs  every 6 (six) hours as needed for wheezing or shortness of breath. 8 g 11   aspirin EC 81 MG tablet Take 1 tablet (81 mg total) by mouth daily. 90 tablet 3   gabapentin (NEURONTIN) 300 MG capsule Take 1 capsule (300 mg total) by mouth 3 (three) times daily as needed (nerve pain). 90 capsule 3   glucose blood (ACCU-CHEK AVIVA PLUS) test strip Test blood sugar twice a day E11.9 200 each 11   lisinopril (ZESTRIL) 40 MG tablet Take 1 tablet (40 mg total) by mouth daily. 90 tablet 3   metoprolol succinate (TOPROL-XL) 50 MG 24 hr tablet Take 1 tablet (50 mg total) by mouth daily. Take with or immediately following a meal. 90 tablet 3   No current facility-administered medications on file prior to visit.        ROS:  All others reviewed and negative.  Objective        PE:  BP (!) 166/90 (BP Location: Right Arm, Patient Position: Sitting, Cuff Size: Large)   Pulse 100   Temp 97.9 F (36.6 C) (Oral)   Ht '5\' 8"'$  (1.727 m)   Wt 257 lb (116.6 kg)   SpO2 98%   BMI 39.08 kg/m                 Constitutional: Pt appears in NAD  HENT: Head: NCAT.                Right Ear: External ear normal.                 Left Ear: External ear normal.                Eyes: . Pupils are equal, round, and reactive to light. Conjunctivae and EOM are normal               Nose: without d/c or deformity               Neck: Neck supple. Gross normal ROM               Cardiovascular: Normal rate and regular rhythm.                 Pulmonary/Chest: Effort normal and breath sounds without rales or wheezing.                Abd:  Soft, NT, ND, + BS, no organomegaly               Neurological: Pt is alert. At baseline orientation, motor grossly intact               Skin: Skin is warm. No rashes, no other new lesions, LE edema - none               Psychiatric: Pt behavior is normal without agitation   Micro: none  Cardiac tracings I have personally interpreted today:  none  Pertinent Radiological findings  (summarize): none   Lab Results  Component Value Date   WBC 6.6 01/23/2022   HGB 14.2 01/23/2022   HCT 43.5 01/23/2022   PLT 265.0 01/23/2022   GLUCOSE 157 (H) 01/23/2022   CHOL 127 01/23/2022   TRIG 148.0 01/23/2022   HDL 41.80 01/23/2022   LDLCALC 56 01/23/2022   ALT 23 01/23/2022   AST 19 01/23/2022   NA 136 01/23/2022   K 4.2 01/23/2022   CL 103 01/23/2022   CREATININE 1.28 01/23/2022   BUN 22 01/23/2022   CO2 24 01/23/2022   TSH 2.36 01/23/2022   PSA 1.39 01/23/2022   HGBA1C 5.7 01/23/2022   MICROALBUR 1.4 01/23/2022   Assessment/Plan:  Jerome [name] is a 51 y.o. Black or African American [2] male with  has a past medical history of ALLERGIC RHINITIS (04/07/2007), ASTHMA (04/07/2007), CONSTIPATION (10/29/2009), Diabetes mellitus type II, non insulin dependent ([place]) (03/08/2011), GERD (04/07/2007), HEMATOCHEZIA (10/29/2009), HYPERTENSION (04/01/2007), LOW BACK PAIN (04/07/2007), and Morbid obesity ([place]) (04/07/2007).  Essential hypertension BP Readings from Last 3 Encounters:  05/05/22 (!) 166/90  01/23/22 124/66  07/25/21 (!) 152/80   Uncontrolled,, pt to continue medical treatment lisinopril 40 mg qd and toprl xl 50 mg qd, and Add amlodipine 10 qd    Diabetes ([place]) Lab Results  Component Value Date   HGBA1C 5.7 01/23/2022   Improved on metofrmin but unfortunately with persistent mild GI symptoms mild intermittent,, pt to change metformin to Farxiga 10 mg qd   Vitamin D deficiency Last vitamin D Lab Results  Component Value Date   VD25OH 46.01 01/23/2022   Stable, cont oral replacement  Followup: Return in about 3 months (around 07/29/2022).  [name], MD 05/05/2022 7:26 PM [facility]

## 2022-05-05 NOTE — Assessment & Plan Note (Signed)
Lab Results  Component Value Date   HGBA1C 5.7 01/23/2022   Improved on metofrmin but unfortunately with persistent mild GI symptoms mild intermittent,, pt to change metformin to Farxiga 10 mg qd

## 2022-05-05 NOTE — Assessment & Plan Note (Signed)
BP Readings from Last 3 Encounters:  05/05/22 (!) 166/90  01/23/22 124/66  07/25/21 (!) 152/80   Uncontrolled,, pt to continue medical treatment lisinopril 40 mg qd and toprl xl 50 mg qd, and Add amlodipine 10 qd

## 2022-05-07 ENCOUNTER — Ambulatory Visit (INDEPENDENT_AMBULATORY_CARE_PROVIDER_SITE_OTHER): Payer: BC Managed Care – PPO | Admitting: Sports Medicine

## 2022-05-07 VITALS — BP 140/82 | HR 108 | Ht 68.0 in | Wt 257.0 lb

## 2022-05-07 DIAGNOSIS — G8929 Other chronic pain: Secondary | ICD-10-CM | POA: Diagnosis not present

## 2022-05-07 DIAGNOSIS — M5442 Lumbago with sciatica, left side: Secondary | ICD-10-CM

## 2022-05-07 DIAGNOSIS — M546 Pain in thoracic spine: Secondary | ICD-10-CM | POA: Diagnosis not present

## 2022-05-07 MED ORDER — MELOXICAM 15 MG PO TABS: 15.0000 mg | ORAL_TABLET | Freq: Every day | ORAL | 0 refills | Status: DC

## 2022-05-07 NOTE — Patient Instructions (Addendum)
Good to see you - Start meloxicam 15 mg daily x2 weeks.  If still having pain after 2 weeks, complete 3rd-week of meloxicam. May use remaining meloxicam as needed once daily for pain control.  Do not to use additional NSAIDs while taking meloxicam.  May use Tylenol [phone] mg 2 to 3 times a day for breakthrough pain. Low back  thoracic HEP  PT referral  4 week follow up

## 2022-05-07 NOTE — Progress Notes (Signed)
Jerome [name] D.[place] Phone: [phone]   Assessment and Plan:     1. Chronic bilateral thoracic back pain 2. Chronic bilateral low back pain with left-sided sciatica -Chronic with exacerbation, initial sports medicine visit - Multiple musculoskeletal complaints throughout thoracic and lumbar spine with left-sided radiculopathy most consistent with left-sided sciatica of gluteal/piriformis etiology - No red flag symptoms on today's visit, so no additional imaging performed - Start HEP and physical therapy for low back, upper back, gluteal muscles - Start meloxicam 15 mg daily x2 weeks.  If still having pain after 2 weeks, complete 3rd-week of meloxicam. May use remaining meloxicam as needed once daily for pain control.  Do not to use additional NSAIDs while taking meloxicam.  May use Tylenol [phone] mg 2 to 3 times a day for breakthrough pain. - Ambulatory referral to Physical Therapy  Other orders - meloxicam (MOBIC) 15 MG tablet; Take 1 tablet (15 mg total) by mouth daily.    Pertinent previous records reviewed include thoracic and lumbar x-ray from 2017   Follow Up: 3 to 4-week follow-up for reevaluation.  Would discontinue meloxicam at that time.  Could consider trigger point injections versus OMT based on patient's symptoms.  If worsening symptoms, would repeat thoracic and lumbar x-rays   Subjective:   I, Jerome [name], am serving as a Education administrator for Doctor Glennon Mac  Chief Complaint: back pain   HPI:   05/07/22 Patient is a 51 year old male complaining of back pain. Patient states that he has had back pain for about 5-6 years, never been bad, but it is irritating , blood pressure keeps spiking, he was hurt on the job in 2019, has been taking tylenol for the pain and that seems to help he only takes it to go to bed, back pain varies to thoracic to low back to sometimes just a burning  sensation throughout the whole back does get radiating pain down to his foot, when he is moving he is fine but when he sits for a long period of time he has pain   Relevant Historical Information: Hypertension  Additional pertinent review of systems negative.   Current Outpatient Medications:    albuterol (VENTOLIN HFA) 108 (90 Base) MCG/ACT inhaler, Inhale 2 puffs into the lungs every 6 (six) hours as needed for wheezing or shortness of breath., Disp: 8 g, Rfl: 11   amLODipine (NORVASC) 10 MG tablet, Take 1 tablet (10 mg total) by mouth daily., Disp: 90 tablet, Rfl: 3   aspirin EC 81 MG tablet, Take 1 tablet (81 mg total) by mouth daily., Disp: 90 tablet, Rfl: 3   dapagliflozin propanediol (FARXIGA) 10 MG TABS tablet, Take 1 tablet (10 mg total) by mouth daily before breakfast., Disp: 90 tablet, Rfl: 3   gabapentin (NEURONTIN) 300 MG capsule, Take 1 capsule (300 mg total) by mouth 3 (three) times daily as needed (nerve pain)., Disp: 90 capsule, Rfl: 3   glucose blood (ACCU-CHEK AVIVA PLUS) test strip, Test blood sugar twice a day E11.9, Disp: 200 each, Rfl: 11   lisinopril (ZESTRIL) 40 MG tablet, Take 1 tablet (40 mg total) by mouth daily., Disp: 90 tablet, Rfl: 3   meloxicam (MOBIC) 15 MG tablet, Take 1 tablet (15 mg total) by mouth daily., Disp: 30 tablet, Rfl: 0   metoprolol succinate (TOPROL-XL) 50 MG 24 hr tablet, Take 1 tablet (50 mg total) by mouth daily. Take with or immediately following  a meal., Disp: 90 tablet, Rfl: 3   Objective:     Vitals:   05/07/22 1446  BP: (!) 140/82  Pulse: (!) 108  SpO2: 98%  Weight: 257 lb (116.6 kg)  Height: '5\' 8"'$  (1.727 m)      Body mass index is 39.08 kg/m.    Physical Exam:    Gen: Appears well, nad, nontoxic and pleasant Psych: Alert and oriented, appropriate mood and affect Neuro: sensation intact, strength is 5/5 in upper and lower extremities, muscle tone wnl Skin: no susupicious lesions or rashes  Back - Normal skin, Spine with  normal alignment and no deformity.   No tenderness to vertebral process palpation.   Paraspinous muscles are mildly tender in thoracic spine and without spasm Straight leg raise negative Trendelenberg negative  Piriformis test negative bilaterally, though caused pulling sensation throughout bilateral glutes and hamstring, worse on left  Electronically signed by:  Jerome [name] D.[facility] 3:30 PM 05/07/22

## 2022-05-26 ENCOUNTER — Telehealth: Payer: Self-pay

## 2022-05-26 NOTE — Telephone Encounter (Signed)
Patient is calling in stating he did a DOT physical and they noticed his BP and HR were out of range so they are trying to fail him. [place] wants to know if we can provide a letter that states he is is being closely monitored for his hypertension and that he has no heart issues. Would like the letter sent through mychart.

## 2022-05-27 NOTE — Telephone Encounter (Signed)
Sorry, we normally would not do this, except on request for further information from the DOT.  BP was elevated at last visit, perhaps we should see him back in the office to address this?   thanks

## 2022-05-27 NOTE — Telephone Encounter (Signed)
Are we able to write a letter for him for this, please advise.

## 2022-06-05 NOTE — Telephone Encounter (Signed)
Spoke with patient and there is no further assistance needed.

## 2022-07-29 ENCOUNTER — Ambulatory Visit: Payer: BC Managed Care – PPO | Admitting: Internal Medicine

## 2023-02-12 ENCOUNTER — Telehealth: Payer: Self-pay | Admitting: Internal Medicine

## 2023-02-12 MED ORDER — METOPROLOL SUCCINATE ER 50 MG PO TB24: 50.0000 mg | ORAL_TABLET | Freq: Every day | ORAL | 0 refills | Status: DC

## 2023-02-12 MED ORDER — LISINOPRIL 40 MG PO TABS: 40.0000 mg | ORAL_TABLET | Freq: Every day | ORAL | 0 refills | Status: DC

## 2023-02-12 NOTE — Telephone Encounter (Signed)
Sent 90 day to [facility] until appt in Sept../lmb

## 2023-02-12 NOTE — Telephone Encounter (Signed)
Prescription Request  02/12/2023  LOV: 05/05/2022  What is the name of the medication or equipment? Metoprolol, lisinopril,   Have you contacted your pharmacy to request a refill? Yes   Which pharmacy would you like this sent to?  [id number] [place], [place] - [address] AT [facility] [address] & [facility] [address] [place] [address] Phone: [phone] Fax: [phone]   Patient notified that their request is being sent to the clinical staff for review and that they should receive a response within 2 business days.   Please advise at Mobile [phone] (mobile)

## 2023-03-16 ENCOUNTER — Other Ambulatory Visit: Payer: Self-pay | Admitting: Internal Medicine

## 2023-04-23 ENCOUNTER — Telehealth: Payer: Self-pay | Admitting: Internal Medicine

## 2023-04-23 ENCOUNTER — Other Ambulatory Visit: Payer: Self-pay

## 2023-04-23 MED ORDER — AMLODIPINE BESYLATE 10 MG PO TABS: 10.0000 mg | ORAL_TABLET | Freq: Every day | ORAL | 3 refills | Status: DC

## 2023-04-23 NOTE — Telephone Encounter (Signed)
Prescription Request  04/23/2023  LOV: 05/05/2022  What is the name of the medication or equipment? amlodopine  Have you contacted your pharmacy to request a refill? Yes   Which pharmacy would you like this sent to?  [id number] [place], [place] - [address] AT [facility] OF [address] & [facility] [address] [place] [address] Phone: [phone] Fax: [phone]     Patient notified that their request is being sent to the clinical staff for review and that they should receive a response within 2 business days.   Please advise at Mobile [phone] (mobile)

## 2023-04-23 NOTE — Telephone Encounter (Signed)
Refill sent.

## 2023-05-12 ENCOUNTER — Other Ambulatory Visit: Payer: Self-pay

## 2023-05-12 ENCOUNTER — Telehealth: Payer: Self-pay | Admitting: Internal Medicine

## 2023-05-12 MED ORDER — METFORMIN HCL ER 500 MG PO TB24: 500.0000 mg | ORAL_TABLET | Freq: Two times a day (BID) | ORAL | 0 refills | Status: DC

## 2023-05-12 MED ORDER — METOPROLOL SUCCINATE ER 50 MG PO TB24: 50.0000 mg | ORAL_TABLET | Freq: Every day | ORAL | 0 refills | Status: DC

## 2023-05-12 MED ORDER — LISINOPRIL 40 MG PO TABS: 40.0000 mg | ORAL_TABLET | Freq: Every day | ORAL | 0 refills | Status: DC

## 2023-05-12 NOTE — Telephone Encounter (Signed)
Prescription Request  05/12/2023  LOV: Visit date not found  What is the name of the medication or equipment?  lisinopril (ZESTRIL) 40 MG tablet    metoprolol succinate (TOPROL-XL) 50 MG 24 hr tablet    metFORMIN (GLUCOPHAGE-XR) 500 MG 24 hr tablet  Pt wants to know why he was taken off this medication because he is requesting to get it refill. Have you contacted your pharmacy to request a refill? No   Which pharmacy would you like this sent to?    [facility] #[id number] [place], [place] - [address] AT [facility] [address] & [facility] [address] [place] [address] Phone: [phone] Fax: [phone]  Patient notified that their request is being sent to the clinical staff for review and that they should receive a response within 2 business days.   Please advise at Mobile [phone] (mobile)

## 2023-05-12 NOTE — Telephone Encounter (Signed)
Refill sent.

## 2023-05-17 ENCOUNTER — Encounter: Payer: Self-pay | Admitting: Internal Medicine

## 2023-05-17 ENCOUNTER — Ambulatory Visit (INDEPENDENT_AMBULATORY_CARE_PROVIDER_SITE_OTHER): Payer: BC Managed Care – PPO | Admitting: Internal Medicine

## 2023-05-17 VITALS — BP 124/76 | HR 68 | Temp 98.5°F | Ht 68.0 in | Wt 254.0 lb

## 2023-05-17 DIAGNOSIS — Z0001 Encounter for general adult medical examination with abnormal findings: Secondary | ICD-10-CM

## 2023-05-17 DIAGNOSIS — I10 Essential (primary) hypertension: Secondary | ICD-10-CM

## 2023-05-17 DIAGNOSIS — Z7984 Long term (current) use of oral hypoglycemic drugs: Secondary | ICD-10-CM

## 2023-05-17 DIAGNOSIS — Z125 Encounter for screening for malignant neoplasm of prostate: Secondary | ICD-10-CM | POA: Diagnosis not present

## 2023-05-17 DIAGNOSIS — E559 Vitamin D deficiency, unspecified: Secondary | ICD-10-CM

## 2023-05-17 DIAGNOSIS — E1165 Type 2 diabetes mellitus with hyperglycemia: Secondary | ICD-10-CM

## 2023-05-17 DIAGNOSIS — J309 Allergic rhinitis, unspecified: Secondary | ICD-10-CM | POA: Diagnosis not present

## 2023-05-17 DIAGNOSIS — J4520 Mild intermittent asthma, uncomplicated: Secondary | ICD-10-CM | POA: Diagnosis not present

## 2023-05-17 DIAGNOSIS — E538 Deficiency of other specified B group vitamins: Secondary | ICD-10-CM | POA: Diagnosis not present

## 2023-05-17 DIAGNOSIS — Z1211 Encounter for screening for malignant neoplasm of colon: Secondary | ICD-10-CM

## 2023-05-17 LAB — HEPATIC FUNCTION PANEL
ALT: 27 U/L (ref 0–53)
AST: 19 U/L (ref 0–37)
Albumin: 4.3 g/dL (ref 3.5–5.2)
Alkaline Phosphatase: 53 U/L (ref 39–117)
Bilirubin, Direct: 0.1 mg/dL (ref 0.0–0.3)
Total Bilirubin: 0.6 mg/dL (ref 0.2–1.2)
Total Protein: 6.9 g/dL (ref 6.0–8.3)

## 2023-05-17 LAB — BASIC METABOLIC PANEL
BUN: 8 mg/dL (ref 6–23)
CO2: 27 meq/L (ref 19–32)
Calcium: 9.7 mg/dL (ref 8.4–10.5)
Chloride: 107 meq/L (ref 96–112)
Creatinine, Ser: 0.99 mg/dL (ref 0.40–1.50)
GFR: 87.25 mL/min (ref >60.00)
Glucose, Bld: 146 mg/dL — ABNORMAL HIGH (ref 70–99)
Potassium: 4.7 meq/L (ref 3.5–5.1)
Sodium: 140 meq/L (ref 135–145)

## 2023-05-17 LAB — LIPID PANEL
Cholesterol: 123 mg/dL (ref 0–200)
HDL: 46 mg/dL (ref >39.00)
LDL Cholesterol: 55 mg/dL (ref 0–99)
NonHDL: 76.95
Total CHOL/HDL Ratio: 3
Triglycerides: 110 mg/dL (ref 0.0–149.0)
VLDL: 22 mg/dL (ref 0.0–40.0)

## 2023-05-17 LAB — CBC WITH DIFFERENTIAL/PLATELET
Basophils Absolute: 0.1 10*3/uL (ref 0.0–0.1)
Basophils Relative: 1.3 % (ref 0.0–3.0)
Eosinophils Absolute: 0.2 10*3/uL (ref 0.0–0.7)
Eosinophils Relative: 3.4 % (ref 0.0–5.0)
HCT: 45.4 % (ref 39.0–52.0)
Hemoglobin: 14.6 g/dL (ref 13.0–17.0)
Lymphocytes Relative: 35.9 % (ref 12.0–46.0)
Lymphs Abs: 2 10*3/uL (ref 0.7–4.0)
MCHC: 32.2 g/dL (ref 30.0–36.0)
MCV: 88.6 fL (ref 78.0–100.0)
Monocytes Absolute: 0.4 10*3/uL (ref 0.1–1.0)
Monocytes Relative: 7.2 % (ref 3.0–12.0)
Neutro Abs: 2.9 10*3/uL (ref 1.4–7.7)
Neutrophils Relative %: 52.2 % (ref 43.0–77.0)
Platelets: 313 10*3/uL (ref 150.0–400.0)
RBC: 5.12 Mil/uL (ref 4.22–5.81)
RDW: 12.6 % (ref 11.5–15.5)
WBC: 5.6 10*3/uL (ref 4.0–10.5)

## 2023-05-17 LAB — PSA: PSA: 2.21 ng/mL (ref 0.10–4.00)

## 2023-05-17 LAB — VITAMIN B12: Vitamin B-12: 446 pg/mL (ref 211–911)

## 2023-05-17 LAB — TSH: TSH: 0.35 u[IU]/mL (ref 0.35–5.50)

## 2023-05-17 LAB — HEMOGLOBIN A1C: Hgb A1c MFr Bld: 6.4 % (ref 4.6–6.5)

## 2023-05-17 LAB — VITAMIN D 25 HYDROXY (VIT D DEFICIENCY, FRACTURES): VITD: 59.52 ng/mL (ref 30.00–100.00)

## 2023-05-17 MED ORDER — MELOXICAM 15 MG PO TABS: 15.0000 mg | ORAL_TABLET | Freq: Every day | ORAL | 3 refills | Status: DC

## 2023-05-17 MED ORDER — ACCU-CHEK AVIVA PLUS VI STRP: ORAL_STRIP | 11 refills | Status: AC

## 2023-05-17 MED ORDER — ALBUTEROL SULFATE HFA 108 (90 BASE) MCG/ACT IN AERS: 2.0000 | INHALATION_SPRAY | Freq: Four times a day (QID) | RESPIRATORY_TRACT | 11 refills | Status: AC | PRN

## 2023-05-17 MED ORDER — METFORMIN HCL ER 500 MG PO TB24: 500.0000 mg | ORAL_TABLET | Freq: Two times a day (BID) | ORAL | 3 refills | Status: DC

## 2023-05-17 MED ORDER — GABAPENTIN 300 MG PO CAPS: 300.0000 mg | ORAL_CAPSULE | Freq: Three times a day (TID) | ORAL | 3 refills | Status: AC | PRN

## 2023-05-17 MED ORDER — LISINOPRIL 40 MG PO TABS: 40.0000 mg | ORAL_TABLET | Freq: Every day | ORAL | 3 refills | Status: DC

## 2023-05-17 MED ORDER — DAPAGLIFLOZIN PROPANEDIOL 10 MG PO TABS: 10.0000 mg | ORAL_TABLET | Freq: Every day | ORAL | 3 refills | Status: DC

## 2023-05-17 MED ORDER — METOPROLOL SUCCINATE ER 50 MG PO TB24: 50.0000 mg | ORAL_TABLET | Freq: Every day | ORAL | 3 refills | Status: DC

## 2023-05-17 MED ORDER — AMLODIPINE BESYLATE 10 MG PO TABS: 10.0000 mg | ORAL_TABLET | Freq: Every day | ORAL | 3 refills | Status: DC

## 2023-05-17 NOTE — Assessment & Plan Note (Signed)
Last vitamin D Lab Results  Component Value Date   VD25OH 46.01 01/23/2022   Stable, cont oral replacement

## 2023-05-17 NOTE — Assessment & Plan Note (Signed)
Stable overall, cont inhaler prn 

## 2023-05-17 NOTE — Progress Notes (Signed)
Patient ID: Jerome [name], male   DOB: Apr 20, 1971, 52 y.o.   MRN: [id number]         Chief Complaint:: wellness exam and DM, asthma, htn, low vit d       HPI:  Jerome [name] is a 52 y.o. male here for wellness exam; for shingrx at pharmacy, for colonoscopy, o/w up to date                        Also Pt denies chest pain, increased sob or doe, wheezing, orthopnea, PND, increased LE swelling, palpitations, dizziness or syncope.   Pt denies polydipsia, polyuria, or new focal neuro s/s.    Pt denies fever, wt loss, night sweats, loss of appetite, or other constitutional symptoms  Does have several wks ongoing nasal allergy symptoms with clearish congestion, itch and sneezing, without fever, pain, ST, cough, swelling or wheezing.   Wt Readings from Last 3 Encounters:  05/17/23 254 lb (115.2 kg)  05/07/22 257 lb (116.6 kg)  05/05/22 257 lb (116.6 kg)   BP Readings from Last 3 Encounters:  05/17/23 124/76  05/07/22 (!) 140/82  05/05/22 (!) 166/90   Immunization History  Administered Date(s) Administered   Influenza Whole 08/17/2001   PFIZER(Purple Top)SARS-COV-2 Vaccination 03/22/2020, 04/21/2020   Td 08/17/1998, 09/06/2008   Tdap 01/05/2019   Health Maintenance Due  Topic Date Due   Colonoscopy  Never done   Zoster Vaccines- Shingrix (1 of 2) Never done   HEMOGLOBIN A1C  07/25/2022   Diabetic kidney evaluation - eGFR measurement  01/24/2023   Diabetic kidney evaluation - Urine ACR  01/24/2023      Past Medical History:  Diagnosis Date   ALLERGIC RHINITIS 04/07/2007   ASTHMA 04/07/2007   CONSTIPATION 10/29/2009   Diabetes mellitus type II, non insulin dependent (HCC) 03/08/2011   Improved w/ weight loss, A1c followed by primary care   GERD 04/07/2007   HEMATOCHEZIA 10/29/2009   HYPERTENSION 04/01/2007   LOW BACK PAIN 04/07/2007   Morbid obesity (HCC) 04/07/2007   Past Surgical History:  Procedure Laterality Date   CHOLECYSTECTOMY  2014    reports that he has quit smoking. His  smoking use included cigarettes. He has never used smokeless tobacco. He reports current alcohol use. He reports that he does not use drugs. family history includes Diabetes Mellitus II in his father; Prostate cancer in his father; Stroke in an other family member; Throat cancer in his maternal uncle. No Known Allergies Current Outpatient Medications on File Prior to Visit  Medication Sig Dispense Refill   aspirin EC 81 MG tablet Take 1 tablet (81 mg total) by mouth daily. 90 tablet 3   No current facility-administered medications on file prior to visit.        ROS:  All others reviewed and negative.  Objective        PE:  BP 124/76 (BP Location: Right Arm, Patient Position: Sitting, Cuff Size: Normal)   Pulse 68   Temp 98.5 F (36.9 C) (Oral)   Ht 5\' 8"  (1.727 m)   Wt 254 lb (115.2 kg)   SpO2 99%   BMI 38.62 kg/m                 Constitutional: Pt appears in NAD               HENT: Head: NCAT.                Right Ear: External ear  normal.                 Left Ear: External ear normal.                Eyes: . Pupils are equal, round, and reactive to light. Conjunctivae and EOM are normal               Nose: without d/c or deformity               Neck: Neck supple. Gross normal ROM               Cardiovascular: Normal rate and regular rhythm.                 Pulmonary/Chest: Effort normal and breath sounds without rales or wheezing.                Abd:  Soft, NT, ND, + BS, no organomegaly               Neurological: Pt is alert. At baseline orientation, motor grossly intact               Skin: Skin is warm. No rashes, no other new lesions, LE edema - none               Psychiatric: Pt behavior is normal without agitation   Micro: none  Cardiac tracings I have personally interpreted today:  none  Pertinent Radiological findings (summarize): none   Lab Results  Component Value Date   WBC 6.6 01/23/2022   HGB 14.2 01/23/2022   HCT 43.5 01/23/2022   PLT 265.0 01/23/2022    GLUCOSE 157 (H) 01/23/2022   CHOL 127 01/23/2022   TRIG 148.0 01/23/2022   HDL 41.80 01/23/2022   LDLCALC 56 01/23/2022   ALT 23 01/23/2022   AST 19 01/23/2022   NA 136 01/23/2022   K 4.2 01/23/2022   CL 103 01/23/2022   CREATININE 1.28 01/23/2022   BUN 22 01/23/2022   CO2 24 01/23/2022   TSH 2.36 01/23/2022   PSA 1.39 01/23/2022   HGBA1C 5.7 01/23/2022   MICROALBUR 1.4 01/23/2022   Assessment/Plan:  Jerome [name] is a 52 y.o. Black or African American [2] male with  has a past medical history of ALLERGIC RHINITIS (04/07/2007), ASTHMA (04/07/2007), CONSTIPATION (10/29/2009), Diabetes mellitus type II, non insulin dependent (HCC) (03/08/2011), GERD (04/07/2007), HEMATOCHEZIA (10/29/2009), HYPERTENSION (04/01/2007), LOW BACK PAIN (04/07/2007), and Morbid obesity (HCC) (04/07/2007).  Encounter for well adult exam with abnormal findings Age and sex appropriate education and counseling updated with regular exercise and diet Referrals for preventative services - for colonoscopy Immunizations addressed - for shingrix at pharmacy Smoking counseling  - none needed Evidence for depression or other mood disorder - none significant Most recent labs reviewed. I have personally reviewed and have noted: 1) the patient's medical and social history 2) The patient's current medications and supplements 3) The patient's height, weight, and BMI have been recorded in the chart   Asthma Stable overall, cont inhaler prn  Diabetes (HCC) Lab Results  Component Value Date   HGBA1C 5.7 01/23/2022   Stable, pt to continue current medical treatment farxiga 10 every day, metformin ER 500 mg bid   Essential hypertension BP Readings from Last 3 Encounters:  05/17/23 124/76  05/07/22 (!) 140/82  05/05/22 (!) 166/90   Stable, pt to continue medical treatment norvasc 10 mg every day, lisnopril 40 every day, toprol xl 50 qd   Vitamin D  deficiency Last vitamin D Lab Results  Component Value Date    VD25OH 46.01 01/23/2022   Stable, cont oral replacement   Allergic rhinitis Mild to mod, for otc allegra and/or nasacort asd,  to f/u any worsening symptoms or concerns  Followup: Return in about 6 months (around 11/14/2023).  Oliver Barre, MD 05/17/2023 8:31 PM [facility] [place] [facility] - [facility] Internal Medicine

## 2023-05-17 NOTE — Assessment & Plan Note (Signed)
Age and sex appropriate education and counseling updated with regular exercise and diet Referrals for preventative services - for colonoscopy Immunizations addressed - for shingrix at pharmacy Smoking counseling  - none needed Evidence for depression or other mood disorder - none significant Most recent labs reviewed. I have personally reviewed and have noted: 1) the patient's medical and social history 2) The patient's current medications and supplements 3) The patient's height, weight, and BMI have been recorded in the chart

## 2023-05-17 NOTE — Patient Instructions (Signed)
Please continue all other medications as before, and refills have been done if requested.  Please have the pharmacy call with any other refills you may need.  Please continue your efforts at being more active, low cholesterol diet, and weight control.  You are otherwise up to date with prevention measures today.  Please keep your appointments with your specialists as you may have planned  You will be contacted regarding the referral for: colonoscopy  Please go to the LAB at the blood drawing area for the tests to be done You will be contacted by phone if any changes need to be made immediately.  Otherwise, you will receive a letter about your results with an explanation, but please check with MyChart first.  Please make an Appointment to return in 6 months, or sooner if needed

## 2023-05-17 NOTE — Assessment & Plan Note (Signed)
Lab Results  Component Value Date   HGBA1C 5.7 01/23/2022   Stable, pt to continue current medical treatment farxiga 10 every day, metformin ER 500 mg bid

## 2023-05-17 NOTE — Assessment & Plan Note (Signed)
BP Readings from Last 3 Encounters:  05/17/23 124/76  05/07/22 (!) 140/82  05/05/22 (!) 166/90   Stable, pt to continue medical treatment norvasc 10 mg every day, lisnopril 40 every day, toprol xl 50 qd

## 2023-05-17 NOTE — Assessment & Plan Note (Signed)
Mild to mod, for otc allegra and/or nasacort asd, to f/u any worsening symptoms or concerns

## 2023-05-18 LAB — MICROALBUMIN / CREATININE URINE RATIO
Creatinine,U: 137.3 mg/dL
Microalb Creat Ratio: 0.5 mg/g (ref 0.0–30.0)
Microalb, Ur: <0.7 mg/dL (ref 0.0–1.9)

## 2023-05-18 LAB — URINALYSIS, ROUTINE W REFLEX MICROSCOPIC
Bilirubin Urine: NEGATIVE
Hgb urine dipstick: NEGATIVE
Ketones, ur: NEGATIVE
Leukocytes,Ua: NEGATIVE
Nitrite: NEGATIVE
RBC / HPF: NONE SEEN (ref 0–?)
Specific Gravity, Urine: 1.02 (ref 1.000–1.030)
Total Protein, Urine: NEGATIVE
Urine Glucose: NEGATIVE
Urobilinogen, UA: 0.2 (ref 0.0–1.0)
pH: 6 (ref 5.0–8.0)

## 2023-05-18 NOTE — Progress Notes (Signed)
The test results show that your current treatment is OK, as the tests are stable.  Please continue the same plan.  There is no other need for change of treatment or further evaluation based on these results, at this time.  thanks 

## 2023-10-14 ENCOUNTER — Ambulatory Visit: Payer: BC Managed Care – PPO | Admitting: Internal Medicine

## 2023-10-18 ENCOUNTER — Encounter: Payer: Self-pay | Admitting: Internal Medicine

## 2023-10-18 ENCOUNTER — Ambulatory Visit (INDEPENDENT_AMBULATORY_CARE_PROVIDER_SITE_OTHER): Payer: BC Managed Care – PPO | Admitting: Internal Medicine

## 2023-10-18 VITALS — BP 128/82 | HR 67 | Temp 98.2°F | Ht 68.0 in | Wt 255.0 lb

## 2023-10-18 DIAGNOSIS — E538 Deficiency of other specified B group vitamins: Secondary | ICD-10-CM

## 2023-10-18 DIAGNOSIS — J309 Allergic rhinitis, unspecified: Secondary | ICD-10-CM | POA: Diagnosis not present

## 2023-10-18 DIAGNOSIS — E559 Vitamin D deficiency, unspecified: Secondary | ICD-10-CM

## 2023-10-18 DIAGNOSIS — I10 Essential (primary) hypertension: Secondary | ICD-10-CM

## 2023-10-18 DIAGNOSIS — E1165 Type 2 diabetes mellitus with hyperglycemia: Secondary | ICD-10-CM

## 2023-10-18 DIAGNOSIS — R6882 Decreased libido: Secondary | ICD-10-CM

## 2023-10-18 DIAGNOSIS — R972 Elevated prostate specific antigen [PSA]: Secondary | ICD-10-CM | POA: Diagnosis not present

## 2023-10-18 DIAGNOSIS — Z7984 Long term (current) use of oral hypoglycemic drugs: Secondary | ICD-10-CM | POA: Diagnosis not present

## 2023-10-18 DIAGNOSIS — Z1211 Encounter for screening for malignant neoplasm of colon: Secondary | ICD-10-CM

## 2023-10-18 LAB — TESTOSTERONE: Testosterone: 301.38 ng/dL (ref 300.00–890.00)

## 2023-10-18 LAB — BASIC METABOLIC PANEL
BUN: 16 mg/dL (ref 6–23)
CO2: 26 meq/L (ref 19–32)
Calcium: 9.8 mg/dL (ref 8.4–10.5)
Chloride: 108 meq/L (ref 96–112)
Creatinine, Ser: 0.99 mg/dL (ref 0.40–1.50)
GFR: 86.99 mL/min (ref >60.00)
Glucose, Bld: 163 mg/dL — ABNORMAL HIGH (ref 70–99)
Potassium: 4.7 meq/L (ref 3.5–5.1)
Sodium: 142 meq/L (ref 135–145)

## 2023-10-18 LAB — HEPATIC FUNCTION PANEL
ALT: 27 U/L (ref 0–53)
AST: 18 U/L (ref 0–37)
Albumin: 4.2 g/dL (ref 3.5–5.2)
Alkaline Phosphatase: 63 U/L (ref 39–117)
Bilirubin, Direct: 0.1 mg/dL (ref 0.0–0.3)
Total Bilirubin: 0.5 mg/dL (ref 0.2–1.2)
Total Protein: 6.9 g/dL (ref 6.0–8.3)

## 2023-10-18 LAB — LIPID PANEL
Cholesterol: 110 mg/dL (ref 0–200)
HDL: 38.1 mg/dL — ABNORMAL LOW (ref >39.00)
LDL Cholesterol: 44 mg/dL (ref 0–99)
NonHDL: 71.47
Total CHOL/HDL Ratio: 3
Triglycerides: 135 mg/dL (ref 0.0–149.0)
VLDL: 27 mg/dL (ref 0.0–40.0)

## 2023-10-18 LAB — HEMOGLOBIN A1C: Hgb A1c MFr Bld: 6.6 % — ABNORMAL HIGH (ref 4.6–6.5)

## 2023-10-18 LAB — VITAMIN B12: Vitamin B-12: 471 pg/mL (ref 211–911)

## 2023-10-18 LAB — VITAMIN D 25 HYDROXY (VIT D DEFICIENCY, FRACTURES): VITD: 52.8 ng/mL (ref 30.00–100.00)

## 2023-10-18 LAB — PSA: PSA: 2.55 ng/mL (ref 0.10–4.00)

## 2023-10-18 NOTE — Patient Instructions (Signed)
 Please call if you change your mind about starting mounjaro or ozempic  Please continue all other medications as before, and refills have been done if requested.  Please have the pharmacy call with any other refills you may need.  Please continue your efforts at being more active, low cholesterol diet, and weight control.  Please keep your appointments with your specialists as you may have planned  Please go to the LAB at the blood drawing area for the tests to be done  You will be contacted by phone if any changes need to be made immediately.  Otherwise, you will receive a letter about your results with an explanation, but please check with MyChart first.  Please make an Appointment to return in 6 months, or sooner if needed

## 2023-10-18 NOTE — Progress Notes (Unsigned)
 Patient ID: Jerome [name], male   DOB: 19-Apr-1971, 53 y.o.   MRN: [id number]        Chief Complaint: follow up stress anxiety and IBS like symptoms, increased psa velocity, low libido       HPI:  Jerome [name] is a 53 y.o. male here with c/o increased and ongoing stressors, now with increased sweats at night and day, bloating, abd pains and subsequent hiccups and belching , hands sweating all despite increased otc supplements beet juice, magnesium, metamucil.  Denies worsening reflux, abd pain, dysphagia, n/v, bowel change or blood. Pt denies chest pain, increased sob or doe, wheezing, orthopnea, PND, increased LE swelling, palpitations, dizziness or syncope.   Pt denies polydipsia, polyuria, or new focal neuro s/s.  Due for cologuard.  Denies urinary symptoms such as dysuria, frequency, urgency, flank pain, hematuria or n/v, fever, chills.  Does have lower libido as well , asks for testosterone level.  Has restarted Vit D recently.  Does have several wks ongoing nasal allergy symptoms with clearish congestion, itch and sneezing, without fever, pain, ST, cough, swelling or wheezing. Wt Readings from Last 3 Encounters:  10/18/23 255 lb (115.7 kg)  05/17/23 254 lb (115.2 kg)  05/07/22 257 lb (116.6 kg)   BP Readings from Last 3 Encounters:  10/18/23 128/82  05/17/23 124/76  05/07/22 (!) 140/82         Past Medical History:  Diagnosis Date   ALLERGIC RHINITIS 04/07/2007   ASTHMA 04/07/2007   CONSTIPATION 10/29/2009   Diabetes mellitus type II, non insulin dependent (HCC) 03/08/2011   Improved w/ weight loss, A1c followed by primary care   GERD 04/07/2007   HEMATOCHEZIA 10/29/2009   HYPERTENSION 04/01/2007   LOW BACK PAIN 04/07/2007   Morbid obesity (HCC) 04/07/2007   Past Surgical History:  Procedure Laterality Date   CHOLECYSTECTOMY  2014    reports that he has quit smoking. His smoking use included cigarettes. He has never used smokeless tobacco. He reports current alcohol use. He reports  that he does not use drugs. family history includes Diabetes Mellitus II in his father; Prostate cancer in his father; Stroke in an other family member; Throat cancer in his maternal uncle. No Known Allergies Current Outpatient Medications on File Prior to Visit  Medication Sig Dispense Refill   albuterol (VENTOLIN HFA) 108 (90 Base) MCG/ACT inhaler Inhale 2 puffs into the lungs every 6 (six) hours as needed for wheezing or shortness of breath. 8 g 11   amLODipine (NORVASC) 10 MG tablet Take 1 tablet (10 mg total) by mouth daily. 90 tablet 3   aspirin EC 81 MG tablet Take 1 tablet (81 mg total) by mouth daily. 90 tablet 3   dapagliflozin propanediol (FARXIGA) 10 MG TABS tablet Take 1 tablet (10 mg total) by mouth daily before breakfast. 90 tablet 3   gabapentin (NEURONTIN) 300 MG capsule Take 1 capsule (300 mg total) by mouth 3 (three) times daily as needed (nerve pain). 90 capsule 3   glucose blood (ACCU-CHEK AVIVA PLUS) test strip Test blood sugar twice a day E11.9 200 each 11   lisinopril (ZESTRIL) 40 MG tablet Take 1 tablet (40 mg total) by mouth daily. Annual appt due in Sept must see provider for future refills 90 tablet 3   meloxicam (MOBIC) 15 MG tablet Take 1 tablet (15 mg total) by mouth daily. 90 tablet 3   metFORMIN (GLUCOPHAGE-XR) 500 MG 24 hr tablet Take 1 tablet (500 mg total) by mouth 2 (two) times  daily. 180 tablet 3   metoprolol succinate (TOPROL-XL) 50 MG 24 hr tablet Take 1 tablet (50 mg total) by mouth daily. Take with or immediately following a meal. Annual appt due in Sept must see provider for future refills 90 tablet 3   No current facility-administered medications on file prior to visit.        ROS:  All others reviewed and negative.  Objective        PE:  BP 128/82 (BP Location: Left Arm, Patient Position: Sitting, Cuff Size: Normal)   Pulse 67   Temp 98.2 F (36.8 C) (Oral)   Ht 5\' 8"  (1.727 m)   Wt 255 lb (115.7 kg)   SpO2 98%   BMI 38.77 kg/m                  Constitutional: Pt appears in NAD               HENT: Head: NCAT.                Right Ear: External ear normal.                 Left Ear: External ear normal.                Eyes: . Pupils are equal, round, and reactive to light. Conjunctivae and EOM are normal               Nose: without d/c or deformity               Neck: Neck supple. Gross normal ROM               Cardiovascular: Normal rate and regular rhythm.                 Pulmonary/Chest: Effort normal and breath sounds without rales or wheezing.                Abd:  Soft, NT, ND, + BS, no organomegaly               Neurological: Pt is alert. At baseline orientation, motor grossly intact               Skin: Skin is warm. No rashes, no other new lesions, LE edema - none               Psychiatric: Pt behavior is normal without agitation   Micro: none  Cardiac tracings I have personally interpreted today:  none  Pertinent Radiological findings (summarize): none   Lab Results  Component Value Date   WBC 5.6 05/17/2023   HGB 14.6 05/17/2023   HCT 45.4 05/17/2023   PLT 313.0 05/17/2023   GLUCOSE 163 (H) 10/18/2023   CHOL 110 10/18/2023   TRIG 135.0 10/18/2023   HDL 38.10 (L) 10/18/2023   LDLCALC 44 10/18/2023   ALT 27 10/18/2023   AST 18 10/18/2023   NA 142 10/18/2023   K 4.7 10/18/2023   CL 108 10/18/2023   CREATININE 0.99 10/18/2023   BUN 16 10/18/2023   CO2 26 10/18/2023   TSH 0.35 05/17/2023   PSA 2.55 10/18/2023   HGBA1C 6.6 (H) 10/18/2023   MICROALBUR <0.7 05/17/2023   Assessment/Plan:  Jerome [name] is a 53 y.o. Black or African American [2] male with  has a past medical history of ALLERGIC RHINITIS (04/07/2007), ASTHMA (04/07/2007), CONSTIPATION (10/29/2009), Diabetes mellitus type II, non insulin dependent (HCC) (03/08/2011), GERD (04/07/2007), HEMATOCHEZIA (10/29/2009), HYPERTENSION (  04/01/2007), LOW BACK PAIN (04/07/2007), and Morbid obesity (HCC) (04/07/2007).  Diabetes (HCC) Lab Results  Component Value  Date   HGBA1C 6.6 (H) 10/18/2023   Stable, pt to continue current medical treatment frxiga 10 every day, metformin ER 500 mg bid   Essential hypertension BP Readings from Last 3 Encounters:  10/18/23 128/82  05/17/23 124/76  05/07/22 (!) 140/82   Stable, pt to continue medical treatment norvasc 10 every day, lisinopril 40 every day, toprol xl 50 qd   Vitamin D deficiency Last vitamin D Lab Results  Component Value Date   VD25OH 52.80 10/18/2023   Stable, cont oral replacement   Increased prostate specific antigen (PSA) velocity Asympt,  Lab Results  Component Value Date   PSA 2.55 10/18/2023   PSA 2.21 05/17/2023   PSA 1.39 01/23/2022   Slightly higher,  to f/u any worsening symptoms or concerns  Low libido Also for testosterone level  Allergic rhinitis Mild to mod, for otc allegra and /or nasacort asd,,  to f/u any worsening symptoms or concerns  Followup: Return in about 6 months (around 04/19/2024).  Oliver Barre, MD 10/20/2023 9:28 PM [facility] [place] [facility] - [facility] Internal Medicine

## 2023-10-18 NOTE — Progress Notes (Signed)
 The test results show that your current treatment is OK, as the tests are stable.  Please continue the same plan.  There is no other need for change of treatment or further evaluation based on these results, at this time.  thanks

## 2023-10-20 ENCOUNTER — Encounter: Payer: Self-pay | Admitting: Internal Medicine

## 2023-10-20 DIAGNOSIS — R6882 Decreased libido: Secondary | ICD-10-CM | POA: Insufficient documentation

## 2023-10-20 NOTE — Assessment & Plan Note (Signed)
 BP Readings from Last 3 Encounters:  10/18/23 128/82  05/17/23 124/76  05/07/22 (!) 140/82   Stable, pt to continue medical treatment norvasc 10 every day, lisinopril 40 every day, toprol xl 50 qd

## 2023-10-20 NOTE — Assessment & Plan Note (Signed)
 Last vitamin D Lab Results  Component Value Date   VD25OH 52.80 10/18/2023   Stable, cont oral replacement

## 2023-10-20 NOTE — Assessment & Plan Note (Signed)
 Lab Results  Component Value Date   HGBA1C 6.6 (H) 10/18/2023   Stable, pt to continue current medical treatment frxiga 10 every day, metformin ER 500 mg bid

## 2023-10-20 NOTE — Assessment & Plan Note (Signed)
 Also for testosterone level

## 2023-10-20 NOTE — Assessment & Plan Note (Signed)
 Asympt,  Lab Results  Component Value Date   PSA 2.55 10/18/2023   PSA 2.21 05/17/2023   PSA 1.39 01/23/2022   Slightly higher,  to f/u any worsening symptoms or concerns

## 2023-10-20 NOTE — Assessment & Plan Note (Signed)
Mild to mod, for otc allegra and/or nasacort asd,,  to f/u any worsening symptoms or concerns

## 2023-11-09 LAB — COLOGUARD: COLOGUARD: NEGATIVE

## 2024-04-21 ENCOUNTER — Telehealth: Payer: Self-pay | Admitting: Radiology

## 2024-04-21 ENCOUNTER — Other Ambulatory Visit: Payer: Self-pay | Admitting: Internal Medicine

## 2024-04-21 DIAGNOSIS — E1165 Type 2 diabetes mellitus with hyperglycemia: Secondary | ICD-10-CM

## 2024-04-21 DIAGNOSIS — E559 Vitamin D deficiency, unspecified: Secondary | ICD-10-CM

## 2024-04-21 DIAGNOSIS — E538 Deficiency of other specified B group vitamins: Secondary | ICD-10-CM

## 2024-04-21 DIAGNOSIS — R972 Elevated prostate specific antigen [PSA]: Secondary | ICD-10-CM

## 2024-04-21 MED ORDER — AMLODIPINE BESYLATE 10 MG PO TABS: 10.0000 mg | ORAL_TABLET | Freq: Every day | ORAL | 0 refills | Status: DC

## 2024-04-21 NOTE — Telephone Encounter (Signed)
 Patient is out of medication

## 2024-04-21 NOTE — Telephone Encounter (Signed)
 Copied from CRM #[id number]. Topic: Clinical - Medication Refill >> Apr 21, 2024 11:04 AM [place] J wrote: Medication: amLODipine  (NORVASC ) 10 MG tablet  Has the patient contacted their pharmacy? Yes (Agent: If no, request that the patient contact the pharmacy for the refill. If patient does not wish to contact the pharmacy document the reason why and proceed with request.) (Agent: If yes, when and what did the pharmacy advise?) Pharmacy told the patient there are no refills available.  This is the patient's preferred pharmacy:  [facility] #[id number] [place], [place] - [address] AT [facility] [address] & [facility] [address] [place] [address] Phone: [phone] Fax: [phone]  Is this the correct pharmacy for this prescription? Yes If no, delete pharmacy and type the correct one.   Has the prescription been filled recently? No  Is the patient out of the medication? Yes  Has the patient been seen for an appointment in the last year OR does the patient have an upcoming appointment? Yes  Can we respond through MyChart? Yes  Agent: Please be advised that Rx refills may take up to 3 business days. We ask that you follow-up with your pharmacy.

## 2024-04-21 NOTE — Telephone Encounter (Signed)
 Copied from CRM #[id number]. Topic: Clinical - Request for Lab/Test Order >> Apr 21, 2024 11:06 AM [place] J wrote: Reason for CRM: Patient is requesting basic labs prior to his physical on 10/01.

## 2024-04-27 NOTE — Telephone Encounter (Signed)
 Ok labs are ordered

## 2024-04-27 NOTE — Addendum Note (Signed)
 Addended by: NORLEEN LYNWOOD ORN on: 04/27/2024 11:38 AM   Modules accepted: Orders

## 2024-05-17 ENCOUNTER — Encounter: Admitting: Internal Medicine

## 2024-06-06 ENCOUNTER — Other Ambulatory Visit (INDEPENDENT_AMBULATORY_CARE_PROVIDER_SITE_OTHER)

## 2024-06-06 DIAGNOSIS — E538 Deficiency of other specified B group vitamins: Secondary | ICD-10-CM | POA: Diagnosis not present

## 2024-06-06 DIAGNOSIS — E1165 Type 2 diabetes mellitus with hyperglycemia: Secondary | ICD-10-CM

## 2024-06-06 DIAGNOSIS — E559 Vitamin D deficiency, unspecified: Secondary | ICD-10-CM | POA: Diagnosis not present

## 2024-06-06 DIAGNOSIS — R972 Elevated prostate specific antigen [PSA]: Secondary | ICD-10-CM

## 2024-06-06 LAB — LIPID PANEL
Cholesterol: 144 mg/dL (ref 0–200)
HDL: 44.6 mg/dL (ref >39.00)
LDL Cholesterol: 77 mg/dL (ref 0–99)
NonHDL: 99.32
Total CHOL/HDL Ratio: 3
Triglycerides: 113 mg/dL (ref 0.0–149.0)
VLDL: 22.6 mg/dL (ref 0.0–40.0)

## 2024-06-06 LAB — URINALYSIS, ROUTINE W REFLEX MICROSCOPIC
Bilirubin Urine: NEGATIVE
Hgb urine dipstick: NEGATIVE
Ketones, ur: NEGATIVE
Leukocytes,Ua: NEGATIVE
Nitrite: NEGATIVE
RBC / HPF: NONE SEEN (ref 0–?)
Specific Gravity, Urine: 1.02 (ref 1.000–1.030)
Total Protein, Urine: NEGATIVE
Urine Glucose: NEGATIVE
Urobilinogen, UA: 0.2 (ref 0.0–1.0)
pH: 6 (ref 5.0–8.0)

## 2024-06-06 LAB — CBC WITH DIFFERENTIAL/PLATELET
Basophils Absolute: 0 K/uL (ref 0.0–0.1)
Basophils Relative: 1 % (ref 0.0–3.0)
Eosinophils Absolute: 0.2 K/uL (ref 0.0–0.7)
Eosinophils Relative: 4.7 % (ref 0.0–5.0)
HCT: 44.8 % (ref 39.0–52.0)
Hemoglobin: 14.7 g/dL (ref 13.0–17.0)
Lymphocytes Relative: 39.2 % (ref 12.0–46.0)
Lymphs Abs: 2 K/uL (ref 0.7–4.0)
MCHC: 32.9 g/dL (ref 30.0–36.0)
MCV: 89.6 fl (ref 78.0–100.0)
Monocytes Absolute: 0.5 K/uL (ref 0.1–1.0)
Monocytes Relative: 9.6 % (ref 3.0–12.0)
Neutro Abs: 2.4 K/uL (ref 1.4–7.7)
Neutrophils Relative %: 45.5 % (ref 43.0–77.0)
Platelets: 266 K/uL (ref 150.0–400.0)
RBC: 5.01 Mil/uL (ref 4.22–5.81)
RDW: 12.4 % (ref 11.5–15.5)
WBC: 5.2 K/uL (ref 4.0–10.5)

## 2024-06-06 LAB — HEPATIC FUNCTION PANEL
ALT: 18 U/L (ref 0–53)
AST: 14 U/L (ref 0–37)
Albumin: 4.3 g/dL (ref 3.5–5.2)
Alkaline Phosphatase: 54 U/L (ref 39–117)
Bilirubin, Direct: 0.2 mg/dL (ref 0.0–0.3)
Total Bilirubin: 0.9 mg/dL (ref 0.2–1.2)
Total Protein: 6.7 g/dL (ref 6.0–8.3)

## 2024-06-06 LAB — BASIC METABOLIC PANEL WITH GFR
BUN: 9 mg/dL (ref 6–23)
CO2: 28 meq/L (ref 19–32)
Calcium: 9.4 mg/dL (ref 8.4–10.5)
Chloride: 106 meq/L (ref 96–112)
Creatinine, Ser: 0.94 mg/dL (ref 0.40–1.50)
GFR: 92.16 mL/min (ref >60.00)
Glucose, Bld: 150 mg/dL — ABNORMAL HIGH (ref 70–99)
Potassium: 4.6 meq/L (ref 3.5–5.1)
Sodium: 141 meq/L (ref 135–145)

## 2024-06-06 LAB — VITAMIN B12: Vitamin B-12: 420 pg/mL (ref 211–911)

## 2024-06-06 LAB — PSA: PSA: 2.94 ng/mL (ref 0.10–4.00)

## 2024-06-06 LAB — HEMOGLOBIN A1C: Hgb A1c MFr Bld: 5.9 % (ref 4.6–6.5)

## 2024-06-06 LAB — MICROALBUMIN / CREATININE URINE RATIO
Creatinine,U: 128.6 mg/dL
Microalb Creat Ratio: 5.7 mg/g (ref 0.0–30.0)
Microalb, Ur: 0.7 mg/dL (ref 0.0–1.9)

## 2024-06-06 LAB — VITAMIN D 25 HYDROXY (VIT D DEFICIENCY, FRACTURES): VITD: 53.24 ng/mL (ref 30.00–100.00)

## 2024-06-06 LAB — TSH: TSH: 0.75 u[IU]/mL (ref 0.35–5.50)

## 2024-06-07 ENCOUNTER — Ambulatory Visit (INDEPENDENT_AMBULATORY_CARE_PROVIDER_SITE_OTHER): Admitting: Internal Medicine

## 2024-06-07 VITALS — BP 142/80 | HR 70 | Temp 97.7°F | Ht 68.0 in | Wt 242.0 lb

## 2024-06-07 DIAGNOSIS — E1165 Type 2 diabetes mellitus with hyperglycemia: Secondary | ICD-10-CM

## 2024-06-07 DIAGNOSIS — E7800 Pure hypercholesterolemia, unspecified: Secondary | ICD-10-CM | POA: Diagnosis not present

## 2024-06-07 DIAGNOSIS — E785 Hyperlipidemia, unspecified: Secondary | ICD-10-CM | POA: Insufficient documentation

## 2024-06-07 DIAGNOSIS — E559 Vitamin D deficiency, unspecified: Secondary | ICD-10-CM

## 2024-06-07 DIAGNOSIS — Z0001 Encounter for general adult medical examination with abnormal findings: Secondary | ICD-10-CM

## 2024-06-07 DIAGNOSIS — Z0000 Encounter for general adult medical examination without abnormal findings: Secondary | ICD-10-CM | POA: Diagnosis not present

## 2024-06-07 DIAGNOSIS — R972 Elevated prostate specific antigen [PSA]: Secondary | ICD-10-CM | POA: Diagnosis not present

## 2024-06-07 DIAGNOSIS — R6882 Decreased libido: Secondary | ICD-10-CM

## 2024-06-07 DIAGNOSIS — I10 Essential (primary) hypertension: Secondary | ICD-10-CM

## 2024-06-07 DIAGNOSIS — Z7984 Long term (current) use of oral hypoglycemic drugs: Secondary | ICD-10-CM

## 2024-06-07 DIAGNOSIS — Z23 Encounter for immunization: Secondary | ICD-10-CM

## 2024-06-07 MED ORDER — METOPROLOL SUCCINATE ER 50 MG PO TB24: 50.0000 mg | ORAL_TABLET | Freq: Every day | ORAL | 3 refills | Status: AC

## 2024-06-07 MED ORDER — LISINOPRIL 40 MG PO TABS: 40.0000 mg | ORAL_TABLET | Freq: Every day | ORAL | 3 refills | Status: AC

## 2024-06-07 MED ORDER — MELOXICAM 15 MG PO TABS: 15.0000 mg | ORAL_TABLET | Freq: Every day | ORAL | 1 refills | Status: AC

## 2024-06-07 MED ORDER — AMLODIPINE BESYLATE 10 MG PO TABS: 10.0000 mg | ORAL_TABLET | Freq: Every day | ORAL | 3 refills | Status: DC

## 2024-06-07 MED ORDER — METFORMIN HCL ER 500 MG PO TB24: 500.0000 mg | ORAL_TABLET | Freq: Two times a day (BID) | ORAL | 3 refills | Status: AC

## 2024-06-07 MED ORDER — DAPAGLIFLOZIN PROPANEDIOL 10 MG PO TABS: 10.0000 mg | ORAL_TABLET | Freq: Every day | ORAL | 3 refills | Status: AC

## 2024-06-07 NOTE — Progress Notes (Unsigned)
 Patient ID: Jerome [name], male   DOB: 24-Oct-1970, 53 y.o.   MRN: [id number]         Chief Complaint:: wellness exam and htn, hld, dm, increased psa velocity, low vit d       HPI:  Jerome [name] is a 53 y.o. male here for wellness exam; declines all vaccinations, states will call for eye exam, o/w up to date                        Also Pt denies chest pain, increased sob or doe, wheezing, orthopnea, PND, increased LE swelling, palpitations, dizziness or syncope.   Pt denies polydipsia, polyuria, or new focal neuro s/s.    Pt denies fever, wt loss, night sweats, loss of appetite, or other constitutional symptoms  Has run out of BP meds in last 3 days.  Denies urinary symptoms such as dysuria, frequency, urgency, flank pain, hematuria or n/v, fever, chills.  Peak wt has been 310 in past, now 242  Wt Readings from Last 3 Encounters:  06/07/24 242 lb (109.8 kg)  10/18/23 255 lb (115.7 kg)  05/17/23 254 lb (115.2 kg)   BP Readings from Last 3 Encounters:  06/07/24 (!) 142/80  10/18/23 128/82  05/17/23 124/76   Immunization History  Administered Date(s) Administered   Influenza Whole 08/17/2001   PFIZER(Purple Top)SARS-COV-2 Vaccination 03/22/2020, 04/21/2020   Td 08/17/1998, 09/06/2008   Tdap 01/05/2019   Health Maintenance Due  Topic Date Due   Pneumococcal Vaccine: 50+ Years (1 of 2 - PCV) Never done   Hepatitis B Vaccines 19-59 Average Risk (1 of 3 - 19+ 3-dose series) Never done   Zoster Vaccines- Shingrix (1 of 2) Never done   OPHTHALMOLOGY EXAM  09/29/2022      Past Medical History:  Diagnosis Date   ALLERGIC RHINITIS 04/07/2007   ASTHMA 04/07/2007   CONSTIPATION 10/29/2009   Diabetes mellitus type II, non insulin dependent (HCC) 03/08/2011   Improved w/ weight loss, A1c followed by primary care   GERD 04/07/2007   HEMATOCHEZIA 10/29/2009   HYPERTENSION 04/01/2007   LOW BACK PAIN 04/07/2007   Morbid obesity (HCC) 04/07/2007   Past Surgical History:  Procedure Laterality Date    CHOLECYSTECTOMY  2014    reports that he has quit smoking. His smoking use included cigarettes. He has never used smokeless tobacco. He reports current alcohol use. He reports that he does not use drugs. family history includes Diabetes Mellitus II in his father; Prostate cancer in his father; Stroke in an other family member; Throat cancer in his maternal uncle. No Known Allergies Current Outpatient Medications on File Prior to Visit  Medication Sig Dispense Refill   albuterol  (VENTOLIN  HFA) 108 (90 Base) MCG/ACT inhaler Inhale 2 puffs into the lungs every 6 (six) hours as needed for wheezing or shortness of breath. 8 g 11   aspirin  EC 81 MG tablet Take 1 tablet (81 mg total) by mouth daily. 90 tablet 3   gabapentin  (NEURONTIN ) 300 MG capsule Take 1 capsule (300 mg total) by mouth 3 (three) times daily as needed (nerve pain). 90 capsule 3   glucose blood (ACCU-CHEK AVIVA PLUS) test strip Test blood sugar twice a day E11.9 200 each 11   No current facility-administered medications on file prior to visit.        ROS:  All others reviewed and negative.  Objective        PE:  BP (!) 142/80   Pulse  70   Temp 97.7 F (36.5 C) (Temporal)   Ht 5' 8 (1.727 m)   Wt 242 lb (109.8 kg)   SpO2 99%   BMI 36.80 kg/m                 Constitutional: Pt appears in NAD               HENT: Head: NCAT.                Right Ear: External ear normal.                 Left Ear: External ear normal.                Eyes: . Pupils are equal, round, and reactive to light. Conjunctivae and EOM are normal               Nose: without d/c or deformity               Neck: Neck supple. Gross normal ROM               Cardiovascular: Normal rate and regular rhythm.                 Pulmonary/Chest: Effort normal and breath sounds without rales or wheezing.                Abd:  Soft, NT, ND, + BS, no organomegaly               Neurological: Pt is alert. At baseline orientation, motor grossly intact                Skin: Skin is warm. No rashes, no other new lesions, LE edema - none               Psychiatric: Pt behavior is normal without agitation   Micro: none  Cardiac tracings I have personally interpreted today:  none  Pertinent Radiological findings (summarize): none   Lab Results  Component Value Date   WBC 5.2 06/06/2024   HGB 14.7 06/06/2024   HCT 44.8 06/06/2024   PLT 266.0 06/06/2024   GLUCOSE 150 (H) 06/06/2024   CHOL 144 06/06/2024   TRIG 113.0 06/06/2024   HDL 44.60 06/06/2024   LDLCALC 77 06/06/2024   ALT 18 06/06/2024   AST 14 06/06/2024   NA 141 06/06/2024   K 4.6 06/06/2024   CL 106 06/06/2024   CREATININE 0.94 06/06/2024   BUN 9 06/06/2024   CO2 28 06/06/2024   TSH 0.75 06/06/2024   PSA 2.94 06/06/2024   HGBA1C 5.9 06/06/2024   MICROALBUR 0.7 06/06/2024   Assessment/Plan:  Jerome [name] is a 53 y.o. Black or African American [2] male with  has a past medical history of ALLERGIC RHINITIS (04/07/2007), ASTHMA (04/07/2007), CONSTIPATION (10/29/2009), Diabetes mellitus type II, non insulin dependent (HCC) (03/08/2011), GERD (04/07/2007), HEMATOCHEZIA (10/29/2009), HYPERTENSION (04/01/2007), LOW BACK PAIN (04/07/2007), and Morbid obesity (HCC) (04/07/2007).  Encounter for well adult exam with abnormal findings Age and sex appropriate education and counseling updated with regular exercise and diet Referrals for preventative services - pt to call for eye exam Immunizations addressed - declines all vaccinations Smoking counseling  - none needed Evidence for depression or other mood disorder - none significant Most recent labs reviewed. I have personally reviewed and have noted: 1) the patient's medical and social history 2) The patient's current medications and supplements 3) The patient's height, weight, and BMI  have been recorded in the chart   Vitamin D  deficiency Last vitamin D  Lab Results  Component Value Date   VD25OH 53.24 06/06/2024   Stable, cont oral  replacement   HLD (hyperlipidemia) Lab Results  Component Value Date   LDLCALC 77 06/06/2024   Uncontrolled mild, pt conts to declines statin or other at this time, for low chol DM diet   Essential hypertension BP Readings from Last 3 Encounters:  06/07/24 (!) 142/80  10/18/23 128/82  05/17/23 124/76   Uncontrolled having run out of meds x 3 days, pt to restart medical treatment norvasc  10 every day, lisinopril  40 every day, toprol  xl 50 qd   Diabetes (HCC) Lab Results  Component Value Date   HGBA1C 5.9 06/06/2024   Stable, pt to continue current medical treatment farxiga  10 every day, metformin  ER 500 mg bid,    Increased prostate specific antigen (PSA) velocity Lab Results  Component Value Date   PSA 2.94 06/06/2024   PSA 2.55 10/18/2023   PSA 2.21 05/17/2023   Pt for f/u psa at 6 mo  Followup: Return in about 6 months (around 12/06/2024).  Jerome Rush, MD 06/08/2024 9:36 PM [facility]

## 2024-06-07 NOTE — Patient Instructions (Signed)
 Please consider have your Shingrix (shingles) shots done at your local pharmacy, and Prevnar pneumonia shot as well  Please remember to call for your next Eye exam  Please continue all other medications as before, and refills have been done if requested.  Please have the pharmacy call with any other refills you may need.  Please continue your efforts at being more active, low cholesterol diet, and weight control.  You are otherwise up to date with prevention measures today.  Please keep your appointments with your specialists as you may have planned  Please make an Appointment to return in 6 months, or sooner if needed, also with Lab Appointment for testing done 3-5 days before at the FIRST FLOOR Lab (so this is for TWO appointments - please see the scheduling desk as you leave)

## 2024-06-08 ENCOUNTER — Encounter: Payer: Self-pay | Admitting: Internal Medicine

## 2024-06-08 NOTE — Assessment & Plan Note (Signed)
 BP Readings from Last 3 Encounters:  06/07/24 (!) 142/80  10/18/23 128/82  05/17/23 124/76   Uncontrolled having run out of meds x 3 days, pt to restart medical treatment norvasc  10 every day, lisinopril  40 every day, toprol  xl 50 qd

## 2024-06-08 NOTE — Assessment & Plan Note (Signed)
 Lab Results  Component Value Date   LDLCALC 77 06/06/2024   Uncontrolled mild, pt conts to declines statin or other at this time, for low chol DM diet

## 2024-06-08 NOTE — Assessment & Plan Note (Addendum)
 Age and sex appropriate education and counseling updated with regular exercise and diet Referrals for preventative services - pt to call for eye exam Immunizations addressed - declines all vaccinations Smoking counseling  - none needed Evidence for depression or other mood disorder - none significant Most recent labs reviewed. I have personally reviewed and have noted: 1) the patient's medical and social history 2) The patient's current medications and supplements 3) The patient's height, weight, and BMI have been recorded in the chart

## 2024-06-08 NOTE — Assessment & Plan Note (Signed)
 Last vitamin D  Lab Results  Component Value Date   VD25OH 53.24 06/06/2024   Stable, cont oral replacement

## 2024-06-08 NOTE — Assessment & Plan Note (Signed)
 Lab Results  Component Value Date   PSA 2.94 06/06/2024   PSA 2.55 10/18/2023   PSA 2.21 05/17/2023   Pt for f/u psa at 6 mo

## 2024-06-08 NOTE — Assessment & Plan Note (Signed)
 Lab Results  Component Value Date   HGBA1C 5.9 06/06/2024   Stable, pt to continue current medical treatment farxiga  10 every day, metformin  ER 500 mg bid,

## 2024-07-17 ENCOUNTER — Other Ambulatory Visit: Payer: Self-pay | Admitting: Internal Medicine
